# Patient Record
Sex: Female | Born: 1990 | Race: Black or African American | Hispanic: No | Smoking: Never smoker
Health system: Southern US, Community
[De-identification: ages and names within clinical notes are randomized; demographics above are authoritative.]

## PROBLEM LIST (undated history)

## (undated) ENCOUNTER — Ambulatory Visit (HOSPITAL_COMMUNITY): Source: Home / Self Care

## (undated) DIAGNOSIS — D649 Anemia, unspecified: Secondary | ICD-10-CM

## (undated) DIAGNOSIS — L309 Dermatitis, unspecified: Secondary | ICD-10-CM

## (undated) HISTORY — DX: Dermatitis, unspecified: L30.9

## (undated) HISTORY — DX: Anemia, unspecified: D64.9

## (undated) HISTORY — PX: OTHER SURGICAL HISTORY: SHX169

---
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
## 2011-05-16 ENCOUNTER — Emergency Department: Payer: Self-pay | Admitting: Emergency Medicine

## 2018-04-27 HISTORY — PX: WISDOM TOOTH EXTRACTION: SHX21

## 2018-12-28 NOTE — L&D Delivery Note (Addendum)
OB/GYN Faculty Practice Delivery Note  Lisa Zimmerman is a 28 y.o. [id number] s/p VD at [redacted]w[redacted]d. She was admitted for SOL.   ROM: 5h 52m with clear fluid GBS Status:  Negative/-- (10/22 1520) Maximum Maternal Temperature:  Temp (24hrs), Avg:98.1 F (36.7 C), Min:97.5 F (36.4 C), Max:98.6 F (37 C)  Labor Progress: . Patient arrived at 6 cm dilation and was eventually augmented with pitocin.   Delivery Date/Time: 10/30/2019 at 2319 Delivery: Called to room and patient was complete and pushing. Head delivered in ROP position.  Nuchal cord was present and immediately reduced. Shoulder and body delivered in usual fashion. Infant with spontaneous cry, placed on mother's abdomen, dried and stimulated. Cord clamped x 2 after 1-minute delay, and cut by father of the baby under my direct supervision. Cord blood drawn. Placenta delivered spontaneously with gentle cord traction. Fundus firm with massage and Pitocin. Labia, perineum, vagina, and cervix inspected and found to have a right labial tear that was repaired with 3-0 Vicryl and a second-degree perineal body tear that was repaired with 2-0 and 3-0 Vicryl with hemostasis achieved and cosmetic outcome achieved..   Placenta: Spontaneous, intact, three-vessel cord Complications: None Lacerations: Right labial tear and second-degree perineal body tear EBL: 172 ml  Analgesia: Epidural  Infant: APGAR (1 MIN): 9   APGAR (5 MINS): 9   APGAR (10 MINS):    Weight: Pending  Gifford Shave, MD  PGY-1, [facility]  10/30/2019 12:01 AM   OB FELLOW DELIVERY ATTESTATION  I was gloved and present for the delivery in its entirety, and I agree with the above resident's note.    Barrington Ellison, MD [facility] Family Medicine Fellow, [facility] for [employer], [place]

## 2019-06-28 LAB — OB RESULTS CONSOLE HIV ANTIBODY (ROUTINE TESTING): HIV: NONREACTIVE

## 2019-06-28 LAB — OB RESULTS CONSOLE HGB/HCT, BLOOD
HCT: 35 (ref 29–41)
Hemoglobin: 11.4

## 2019-06-28 LAB — OB RESULTS CONSOLE GC/CHLAMYDIA
Chlamydia: NEGATIVE
Gonorrhea: NEGATIVE

## 2019-06-28 LAB — CYSTIC FIBROSIS DIAGNOSTIC STUDY: Interpretation-CFDNA:: NEGATIVE

## 2019-06-28 LAB — OB RESULTS CONSOLE ABO/RH: RH Type: POSITIVE

## 2019-06-28 LAB — OB RESULTS CONSOLE HEPATITIS B SURFACE ANTIGEN: Hepatitis B Surface Ag: NEGATIVE

## 2019-06-28 LAB — OB RESULTS CONSOLE RUBELLA ANTIBODY, IGM: Rubella: IMMUNE

## 2019-06-28 LAB — OB RESULTS CONSOLE PLATELET COUNT: Platelets: 336

## 2019-06-28 LAB — OB RESULTS CONSOLE VARICELLA ZOSTER ANTIBODY, IGG: Varicella: IMMUNE

## 2019-06-28 LAB — SICKLE CELL SCREEN: Sickle Cell Screen: NEGATIVE

## 2019-06-28 LAB — OB RESULTS CONSOLE RPR: RPR: NONREACTIVE

## 2019-06-28 LAB — OB RESULTS CONSOLE ANTIBODY SCREEN: Antibody Screen: NEGATIVE

## 2019-07-13 ENCOUNTER — Emergency Department (HOSPITAL_COMMUNITY): Payer: Medicaid Other

## 2019-07-13 ENCOUNTER — Encounter (HOSPITAL_COMMUNITY): Payer: Self-pay

## 2019-07-13 ENCOUNTER — Emergency Department (HOSPITAL_COMMUNITY)
Admission: EM | Admit: 2019-07-13 | Discharge: 2019-07-13 | Disposition: A | Discharge status: Home / Self Care | Payer: Medicaid Other | Attending: Emergency Medicine | Admitting: Emergency Medicine

## 2019-07-13 ENCOUNTER — Other Ambulatory Visit: Payer: Self-pay

## 2019-07-13 DIAGNOSIS — O26899 Other specified pregnancy related conditions, unspecified trimester: Secondary | ICD-10-CM | POA: Insufficient documentation

## 2019-07-13 DIAGNOSIS — R1011 Right upper quadrant pain: Secondary | ICD-10-CM | POA: Diagnosis not present

## 2019-07-13 DIAGNOSIS — R1084 Generalized abdominal pain: Secondary | ICD-10-CM | POA: Insufficient documentation

## 2019-07-13 DIAGNOSIS — Z3A21 21 weeks gestation of pregnancy: Secondary | ICD-10-CM | POA: Insufficient documentation

## 2019-07-13 LAB — URINALYSIS, ROUTINE W REFLEX MICROSCOPIC
Bilirubin Urine: NEGATIVE
Glucose, UA: NEGATIVE mg/dL
Hgb urine dipstick: NEGATIVE
Ketones, ur: 5 mg/dL — AB
Leukocytes,Ua: NEGATIVE
Nitrite: NEGATIVE
Protein, ur: NEGATIVE mg/dL
Specific Gravity, Urine: 1.02 (ref 1.005–1.030)
pH: 6 (ref 5.0–8.0)

## 2019-07-13 LAB — CBC WITH DIFFERENTIAL/PLATELET
Abs Immature Granulocytes: 0.04 10*3/uL (ref 0.00–0.07)
Basophils Absolute: 0 10*3/uL (ref 0.0–0.1)
Basophils Relative: 0 %
Eosinophils Absolute: 0.2 10*3/uL (ref 0.0–0.5)
Eosinophils Relative: 2 %
HCT: 33.4 % — ABNORMAL LOW (ref 36.0–46.0)
Hemoglobin: 10.5 g/dL — ABNORMAL LOW (ref 12.0–15.0)
Immature Granulocytes: 1 %
Lymphocytes Relative: 24 %
Lymphs Abs: 2.1 10*3/uL (ref 0.7–4.0)
MCH: 30.4 pg (ref 26.0–34.0)
MCHC: 31.4 g/dL (ref 30.0–36.0)
MCV: 96.8 fL (ref 80.0–100.0)
Monocytes Absolute: 0.7 10*3/uL (ref 0.1–1.0)
Monocytes Relative: 8 %
Neutro Abs: 5.7 10*3/uL (ref 1.7–7.7)
Neutrophils Relative %: 65 %
Platelets: 277 10*3/uL (ref 150–400)
RBC: 3.45 MIL/uL — ABNORMAL LOW (ref 3.87–5.11)
RDW: 13.1 % (ref 11.5–15.5)
WBC: 8.7 10*3/uL (ref 4.0–10.5)
nRBC: 0 % (ref 0.0–0.2)

## 2019-07-13 LAB — COMPREHENSIVE METABOLIC PANEL
ALT: 14 U/L (ref 0–44)
AST: 15 U/L (ref 15–41)
Albumin: 3 g/dL — ABNORMAL LOW (ref 3.5–5.0)
Alkaline Phosphatase: 44 U/L (ref 38–126)
Anion gap: 8 (ref 5–15)
BUN: 7 mg/dL (ref 6–20)
CO2: 21 mmol/L — ABNORMAL LOW (ref 22–32)
Calcium: 8.3 mg/dL — ABNORMAL LOW (ref 8.9–10.3)
Chloride: 108 mmol/L (ref 98–111)
Creatinine, Ser: 0.48 mg/dL (ref 0.44–1.00)
GFR calc Af Amer: >60 mL/min (ref >60)
GFR calc non Af Amer: >60 mL/min (ref >60)
Glucose, Bld: 96 mg/dL (ref 70–99)
Potassium: 3.6 mmol/L (ref 3.5–5.1)
Sodium: 137 mmol/L (ref 135–145)
Total Bilirubin: <0.1 mg/dL — ABNORMAL LOW (ref 0.3–1.2)
Total Protein: 6.7 g/dL (ref 6.5–8.1)

## 2019-07-13 LAB — LIPASE, BLOOD: Lipase: 32 U/L (ref 11–51)

## 2019-07-13 MED ORDER — SODIUM CHLORIDE 0.9 % IV BOLUS
500.0000 mL | Freq: Once | INTRAVENOUS | Status: AC
  Administered 2019-07-13: 09:00:00 500 mL via INTRAVENOUS

## 2019-07-13 NOTE — ED Notes (Signed)
Patient transported to MRI 

## 2019-07-13 NOTE — ED Provider Notes (Signed)
Patient signed out to me by Dr. Laverta Baltimore pending MRI results.  MRI negative for appendicitis.  Does show 2 liver lesions and patient instructed to follow-up for this.   Lacretia Leigh, MD 07/13/19 1535

## 2019-07-13 NOTE — ED Notes (Signed)
Fetal heart rate= 150bpm

## 2019-07-13 NOTE — Discharge Instructions (Addendum)
Your MRI shows that you have 2 small lesions in your liver that will require an additional MRI after you give birth.  Please follow-up with this to make sure that you do not have anything serious

## 2019-07-13 NOTE — ED Triage Notes (Signed)
Pt c/o of R flank pain, that is tender to touch and began at 4 am.  Pt also vomited around 7 am.

## 2019-07-13 NOTE — ED Provider Notes (Signed)
Emergency Department Provider Note   I have reviewed the triage vital signs and the nursing notes.   HISTORY  Chief Complaint No chief complaint on file.   HPI Lisa Zimmerman is a 28 y.o. female G1P0 currently [redacted] weeks pregnant presents to the emergency department for evaluation of acute onset right flank and abdominal pain.  Symptoms began at 4 AM this morning with severe pain causing vomiting at 7 AM.  Patient denies radiation of symptoms.  She states that her pain is primarily in her right, mid back.  No blood in the vomit.  No shortness of breath or chest pain.  Denies diarrhea.  She denies any lower abdominal cramping or discomfort.  No vaginal bleeding.  Denies any vaginal fluid or discharge.  She states that she was treated for urinary tract infection last week and is not currently having dysuria, hesitancy, urgency.   History reviewed. No pertinent past medical history.  There are no active problems to display for this patient.   Past Surgical History:  Procedure Laterality Date   sinus polyp removal      Allergies Patient has no known allergies.  No family history on file.  Social History Social History   Tobacco Use   Smoking status: Not on file  Substance Use Topics   Alcohol use: Not Currently   Drug use: Not Currently    Review of Systems  Constitutional: No fever/chills Eyes: No visual changes. ENT: No sore throat. Cardiovascular: Denies chest pain. Respiratory: Denies shortness of breath. Gastrointestinal: Positive right flank/abdominal pain. Positive nausea and vomiting.  No diarrhea.  No constipation. Genitourinary: Negative for dysuria. Musculoskeletal: Positive right mid-back pain.  Skin: Negative for rash. Neurological: Negative for headaches, focal weakness or numbness.  10-point ROS otherwise negative.  ____________________________________________   PHYSICAL EXAM:  VITAL SIGNS: ED Triage Vitals  Enc Vitals Group     BP  07/13/19 0800 129/76     Pulse Rate 07/13/19 0800 81     Resp 07/13/19 0800 17     Temp 07/13/19 0800 98 F (36.7 C)     Temp Source 07/13/19 0800 Oral     SpO2 07/13/19 0800 100 %     Weight 07/13/19 0804 156 lb (70.8 kg)     Height 07/13/19 0804 5\' 4"  (1.626 m)   Constitutional: Alert and oriented. Well appearing and in no acute distress. Eyes: Conjunctivae are normal.  Head: Atraumatic. Nose: No congestion/rhinnorhea. Mouth/Throat: Mucous membranes are moist.  Neck: No stridor.   Cardiovascular: Normal rate, regular rhythm. Good peripheral circulation. Grossly normal heart sounds.   Respiratory: Normal respiratory effort.  No retractions. Lungs CTAB. Gastrointestinal: Soft with tenderness primarily along the right flank and RUQ. Mild RLQ tenderness. No left sided abdominal tenderness. Gravid abdomen.  Musculoskeletal: No gross deformities of extremities. Neurologic:  Normal speech and language.  Skin:  Skin is warm, dry and intact. No rash noted.  ____________________________________________   LABS (all labs ordered are listed, but only abnormal results are displayed)  Labs Reviewed  COMPREHENSIVE METABOLIC PANEL - Abnormal; Notable for the following components:      Result Value   CO2 21 (*)    Calcium 8.3 (*)    Albumin 3.0 (*)    Total Bilirubin <0.1 (*)    All other components within normal limits  CBC WITH DIFFERENTIAL/PLATELET - Abnormal; Notable for the following components:   RBC 3.45 (*)    Hemoglobin 10.5 (*)    HCT 33.4 (*)  All other components within normal limits  URINALYSIS, ROUTINE W REFLEX MICROSCOPIC - Abnormal; Notable for the following components:   Color, Urine AMBER (*)    Ketones, ur 5 (*)    All other components within normal limits  URINE CULTURE  LIPASE, BLOOD   ____________________________________________  RADIOLOGY  Dg Abdomen 1 View  Result Date: 07/13/2019 CLINICAL DATA:  Right flank pain EXAM: ABDOMEN - 1 VIEW COMPARISON:   Same-day renal ultrasound FINDINGS: Nonobstructive bowel gas pattern. Moderate to large colonic stool burden. No gross free intraperitoneal air. No pathologic calcification identified to suggest nephrolithiasis. Fetal skeleton projects over the right hemiabdomen. IMPRESSION: Moderate to large colonic stool burden. Fetal skeleton in situ. Electronically Signed   By: Duanne GuessNicholas  Plundo M.D.   On: 07/13/2019 13:16   Mr Pelvis Wo Contrast  Result Date: 07/13/2019 CLINICAL DATA:  Right lower abdominal and flank pain and tenderness. Clinical suspicion for appendicitis. [redacted] weeks pregnant. EXAM: MRI ABDOMEN AND PELVIS WITHOUT CONTRAST TECHNIQUE: Multiplanar multisequence MR imaging of the abdomen and pelvis was performed. No intravenous contrast was administered. COMPARISON:  None. FINDINGS: COMBINED FINDINGS FOR BOTH MR ABDOMEN AND PELVIS Lower chest: No acute findings. Hepatobiliary: A 2.7 cm lesion is seen in segment 7, and a 9 mm lesion is seen in the central left hepatic lobe adjacent to the left portal vein, both showing mild T2 hyperintensity. These lesions cannot be characterized on this unenhanced exam. Gallbladder is unremarkable. No evidence of biliary ductal dilatation. Pancreas: No mass or inflammatory process visualized on this unenhanced exam. Spleen:  Within normal limits in size. Adrenals/Urinary tract: Mild right hydroureteronephrosis seen, which appears to be due to compression of the distal right ureter by the gravid uterus. Stomach/Bowel: No evidence of obstruction, inflammatory process, or abnormal fluid collections. Normal appendix visualized. Vascular/Lymphatic: No pathologically enlarged lymph nodes identified. No evidence of abdominal aortic aneurysm. Reproductive: A single intrauterine fetus is seen in cephalic presentation. Other:  None. Musculoskeletal:  No suspicious bone lesions identified. IMPRESSION: Single intrauterine fetus in cephalic presentation. No evidence of appendicitis. Mild  right sided hydronephrosis of pregnancy. Two indeterminate liver lesions which cannot be characterized on this unenhanced exam. Recommend continued follow-up with abdomen MRI without and with contrast postpartum. Electronically Signed   By: Danae OrleansJohn A Stahl M.D.   On: 07/13/2019 15:11   Mr Abdomen Wo Contrast  Result Date: 07/13/2019 CLINICAL DATA:  Right lower abdominal and flank pain and tenderness. Clinical suspicion for appendicitis. [redacted] weeks pregnant. EXAM: MRI ABDOMEN AND PELVIS WITHOUT CONTRAST TECHNIQUE: Multiplanar multisequence MR imaging of the abdomen and pelvis was performed. No intravenous contrast was administered. COMPARISON:  None. FINDINGS: COMBINED FINDINGS FOR BOTH MR ABDOMEN AND PELVIS Lower chest: No acute findings. Hepatobiliary: A 2.7 cm lesion is seen in segment 7, and a 9 mm lesion is seen in the central left hepatic lobe adjacent to the left portal vein, both showing mild T2 hyperintensity. These lesions cannot be characterized on this unenhanced exam. Gallbladder is unremarkable. No evidence of biliary ductal dilatation. Pancreas: No mass or inflammatory process visualized on this unenhanced exam. Spleen:  Within normal limits in size. Adrenals/Urinary tract: Mild right hydroureteronephrosis seen, which appears to be due to compression of the distal right ureter by the gravid uterus. Stomach/Bowel: No evidence of obstruction, inflammatory process, or abnormal fluid collections. Normal appendix visualized. Vascular/Lymphatic: No pathologically enlarged lymph nodes identified. No evidence of abdominal aortic aneurysm. Reproductive: A single intrauterine fetus is seen in cephalic presentation. Other:  None. Musculoskeletal:  No  suspicious bone lesions identified. IMPRESSION: Single intrauterine fetus in cephalic presentation. No evidence of appendicitis. Mild right sided hydronephrosis of pregnancy. Two indeterminate liver lesions which cannot be characterized on this unenhanced exam.  Recommend continued follow-up with abdomen MRI without and with contrast postpartum. Electronically Signed   By: Marlaine Hind M.D.   On: 07/13/2019 15:11   US Renal  Result Date: 07/13/2019 CLINICAL DATA:  Right flank pain EXAM: RENAL / URINARY TRACT ULTRASOUND COMPLETE COMPARISON:  None. FINDINGS: Right Kidney: Renal measurements: 11.7 x 4.4 x 4.8 cm = volume: 127.4 mL . Echogenicity and renal cortical thickness are within normal limits. No mass or perinephric fluid visualized. There is slight fullness of the right renal collecting system. Left Kidney: Renal measurements: 11.0 x 5.9 x 4.9 cm = volume: 167.2 mL. Echogenicity and renal cortical thickness are within normal limits. No mass, perinephric fluid, or hydronephrosis visualized. No sonographically demonstrable calculus or ureterectasis. Bladder: Appears normal for degree of bladder distention. Intrauterine gestation impresses upon the bladder superiorly. IMPRESSION: Slight fullness of the right renal collecting system without obstructing focus evident. Study otherwise unremarkable. Electronically Signed   By: Lowella Grip III M.D.   On: 07/13/2019 11:59   US Abdomen Limited Ruq  Result Date: 07/13/2019 CLINICAL DATA:  Right upper quadrant pain EXAM: ULTRASOUND ABDOMEN LIMITED RIGHT UPPER QUADRANT COMPARISON:  None. FINDINGS: Gallbladder: No gallstones or wall thickening visualized. There is no pericholecystic fluid. There is mild sludge in the gallbladder. No sonographic Murphy sign noted by sonographer. Common bile duct: Diameter: 5 mm. No intrahepatic or extrahepatic biliary duct dilatation. Liver: No focal lesion identified. Within normal limits in parenchymal echogenicity. Portal vein is patent on color Doppler imaging with normal direction of blood flow towards the liver. IMPRESSION: There is mild sludge in the gallbladder. No gallstones, gallbladder wall thickening, or pericholecystic fluid. Study otherwise unremarkable. Electronically  Signed   By: Lowella Grip III M.D.   On: 07/13/2019 11:11    ____________________________________________   PROCEDURES  Procedure(s) performed:   Procedures  None  ____________________________________________   INITIAL IMPRESSION / ASSESSMENT AND PLAN / ED COURSE  Pertinent labs & imaging results that were available during my care of the patient were reviewed by me and considered in my medical decision making (see chart for details).   Patient presents to the emergency department for evaluation of right flank pain and abdominal discomfort.  No significant lower abdominal discomfort.  Mild pain in the right lower quadrant but lower suspicion for appendicitis but considering that anatomy could be distorted in pregnancy.  Plan for right upper quadrant ultrasound to assess for acute cholecystitis along with renal ultrasound to assess for right-sided hydronephrosis with possible kidney stone.   Renal ultrasound and right upper quadrant ultrasound reviewed.  Mild fullness on the right renal collecting system.  Likely physiologic from pregnancy but discussed with the patient obtaining a KUB to see a possible radiopaque stone.  Discussed the minimal radiation exposure during this procedure and patient consented to the film.  This was reviewed with moderate constipation but no stone visualized.  Reviewed the lab work.  Patient does not have leukocytosis.  No evidence of a UTI to suspect pyelonephritis.  I discussed the case with radiology regarding imaging selection.  Either CT abdomen pelvis or MRI w/o contrast would be acceptable.  I discussed these options with the patient and she would prefer no additional radiation and has opted for MRI of the abdomen without contrast.  This was ordered.  Overall, her pain is improved.  If MRI is negative she will be discharged home for follow-up with OB and stool softener Rx.   Care transferred to Dr. Freida BusmanAllen pending MRI results.    ____________________________________________  FINAL CLINICAL IMPRESSION(S) / ED DIAGNOSES  Final diagnoses:  RUQ abdominal pain    MEDICATIONS GIVEN DURING THIS VISIT:  Medications  sodium chloride 0.9 % bolus 500 mL (0 mLs Intravenous Stopped 07/13/19 1419)    Note:  This document was prepared using Dragon voice recognition software and may include unintentional dictation errors.  Alona BeneJoshua Demarques Pilz, MD Emergency Medicine    Patches Mcdonnell, Arlyss RepressJoshua G, MD 07/13/19 [phone]

## 2019-07-13 NOTE — ED Notes (Signed)
Pt informed of needed urine sample 

## 2019-07-14 LAB — URINE CULTURE
Culture: <10000 — AB
Special Requests: NORMAL

## 2019-08-28 ENCOUNTER — Encounter: Payer: Self-pay | Admitting: General Practice

## 2019-09-05 ENCOUNTER — Encounter: Payer: Self-pay | Admitting: General Practice

## 2019-09-06 ENCOUNTER — Encounter: Payer: Self-pay | Admitting: *Deleted

## 2019-09-06 ENCOUNTER — Other Ambulatory Visit: Payer: Self-pay | Admitting: *Deleted

## 2019-09-06 DIAGNOSIS — Z3480 Encounter for supervision of other normal pregnancy, unspecified trimester: Secondary | ICD-10-CM | POA: Insufficient documentation

## 2019-09-08 ENCOUNTER — Encounter: Payer: Self-pay | Admitting: General Practice

## 2019-09-13 ENCOUNTER — Encounter: Payer: Medicaid Other | Admitting: Certified Nurse Midwife

## 2019-09-14 ENCOUNTER — Encounter: Payer: Self-pay | Admitting: Obstetrics and Gynecology

## 2019-09-14 ENCOUNTER — Other Ambulatory Visit: Payer: Self-pay

## 2019-09-14 ENCOUNTER — Encounter: Payer: Self-pay | Admitting: General Practice

## 2019-09-14 ENCOUNTER — Ambulatory Visit (INDEPENDENT_AMBULATORY_CARE_PROVIDER_SITE_OTHER): Payer: Medicaid Other | Admitting: Obstetrics and Gynecology

## 2019-09-14 DIAGNOSIS — Z3A31 31 weeks gestation of pregnancy: Secondary | ICD-10-CM | POA: Diagnosis not present

## 2019-09-14 DIAGNOSIS — Z3483 Encounter for supervision of other normal pregnancy, third trimester: Secondary | ICD-10-CM

## 2019-09-14 DIAGNOSIS — Z3480 Encounter for supervision of other normal pregnancy, unspecified trimester: Secondary | ICD-10-CM

## 2019-09-14 MED ORDER — VITAFOL GUMMIES 3.33-0.333-34.8 MG PO CHEW: 3.0000 | CHEWABLE_TABLET | Freq: Every day | ORAL | 0 refills | Status: DC

## 2019-09-14 NOTE — Progress Notes (Signed)
Transfer Prenatal Care from [facility] --> see prenatal records under media tab Subjective:    Lisa Zimmerman is being seen today for her first obstetrical visit. She is transferring her prenatal care from [facility]. This is a planned pregnancy. She is at [redacted]w[redacted]d gestation. Her obstetrical history is significant for 2 lesions on liver. Dx'd 07/13/19.Marland Kitchen Relationship with FOB Lisa Zimmerman): significant other, living together. Patient does intend to breast feed. Pregnancy history fully reviewed. Records of [facility] reviewed.  Patient reports no complaints.  Review of Systems:   Review of Systems  Constitutional: Negative.   HENT: Negative.   Eyes: Negative.   Respiratory: Negative.   Cardiovascular: Negative.   Gastrointestinal: Negative.   Endocrine: Negative.   Genitourinary: Negative.   Musculoskeletal: Negative.   Skin: Negative.   Allergic/Immunologic: Negative.   Neurological: Negative.   Hematological: Negative.   Psychiatric/Behavioral: Negative.     Objective:     BP 111/75   Pulse (!) 103   Temp 98.8 F (37.1 C)   Wt 164 lb 12.8 oz (74.8 kg)   LMP 02/10/2019 (Approximate)   BMI 28.29 kg/m  Physical Exam  Nursing note and vitals reviewed. Constitutional: She is oriented to person, place, and time. She appears well-developed and well-nourished.  HENT:  Head: Normocephalic and atraumatic.  Eyes: Pupils are equal, round, and reactive to light.  Neck: Normal range of motion. Neck supple.  Cardiovascular: Normal rate, regular rhythm, normal heart sounds and intact distal pulses.  Respiratory: Effort normal and breath sounds normal.  GI: Soft. Bowel sounds are normal.  Genitourinary:    Genitourinary Comments: Pelvic deferred   Musculoskeletal: Normal range of motion.  Neurological: She is alert and oriented to person, place, and time. She has normal reflexes.  Skin: Skin is warm and dry.  Psychiatric: She has a normal mood and affect. Her behavior is normal. Judgment and thought content  normal.    Maternal Exam:  Abdomen: Patient reports no abdominal tenderness. Fundal height is 31.    Introitus: not evaluated.   Cervix: not evaluated.   Fetal Exam Fetal Monitor Review: Mode: hand-held doppler probe.   Baseline rate: 136 bpm.         Assessment:    Pregnancy: G2P0010 Patient Active Problem List   Diagnosis Date Noted  . Supervision of other normal pregnancy, antepartum 09/06/2019       Plan:   Prenatal labs reviewed. Prenatal vitamins. Problem list reviewed and updated. AFP3 discussed: results reviewed. Role of ultrasound in pregnancy discussed; fetal survey: results reviewed. Amniocentesis discussed: not indicated. The nature of [facility] with multiple MDs and other Advanced Practice Providers was explained to patient; also emphasized that residents, students are part of our team.  Discussed optimized OB schedule and video visits. Advised can have an in-office visit whenever she feels she needs to be seen.  Does not have own BP cuff. BP cuff Rx faxed today. Explained to patient that BP cuff will be mailed to her house. Advised to call during normal business hours and there is an after-hours nurse line available.  Follow up in 5 weeks for GBS and cervical exam. 50% of 40 min visit spent on counseling and coordination of care.     Laury Deep, MSN, CNM 09/14/2019

## 2019-09-15 NOTE — Progress Notes (Signed)
This encounter was created in error - please disregard.

## 2019-09-19 ENCOUNTER — Encounter: Payer: Self-pay | Admitting: General Practice

## 2019-10-19 ENCOUNTER — Ambulatory Visit (INDEPENDENT_AMBULATORY_CARE_PROVIDER_SITE_OTHER): Payer: Medicaid Other | Admitting: Obstetrics and Gynecology

## 2019-10-19 ENCOUNTER — Other Ambulatory Visit (HOSPITAL_COMMUNITY)
Admission: RE | Admit: 2019-10-19 | Discharge: 2019-10-19 | Disposition: A | Discharge status: Home / Self Care | Payer: Medicaid Other | Source: Ambulatory Visit | Attending: Obstetrics and Gynecology | Admitting: Obstetrics and Gynecology

## 2019-10-19 ENCOUNTER — Other Ambulatory Visit: Payer: Self-pay

## 2019-10-19 VITALS — BP 113/72 | HR 97 | Temp 98.6°F | Wt 170.2 lb

## 2019-10-19 DIAGNOSIS — Z3483 Encounter for supervision of other normal pregnancy, third trimester: Secondary | ICD-10-CM | POA: Diagnosis not present

## 2019-10-19 DIAGNOSIS — Z3480 Encounter for supervision of other normal pregnancy, unspecified trimester: Secondary | ICD-10-CM | POA: Insufficient documentation

## 2019-10-19 DIAGNOSIS — Z3A36 36 weeks gestation of pregnancy: Secondary | ICD-10-CM | POA: Diagnosis not present

## 2019-10-19 MED ORDER — VITAFOL GUMMIES 3.33-0.333-34.8 MG PO CHEW: 3.0000 | CHEWABLE_TABLET | Freq: Every day | ORAL | 0 refills | Status: DC

## 2019-10-19 MED ORDER — VITAFOL GUMMIES 3.33-0.333-34.8 MG PO CHEW: 3.0000 | CHEWABLE_TABLET | Freq: Every day | ORAL | 12 refills | Status: DC

## 2019-10-19 MED ORDER — VITAFOL GUMMIES 3.33-0.333-34.8 MG PO CHEW: 3.0000 | CHEWABLE_TABLET | Freq: Every day | ORAL | 10 refills | Status: DC

## 2019-10-19 NOTE — Progress Notes (Signed)
LOW-RISK PREGNANCY OFFICE VISIT Patient name: Lisa Zimmerman MRN [id number]  Date of birth: [birth year] Chief Complaint:   Routine Prenatal Visit  History of Present Illness:   Lisa Zimmerman is a 28 y.o. G89P0010 female at [redacted]w[redacted]d with an Estimated Date of Delivery: 11/12/19 being seen today for ongoing management of a low-risk pregnancy.  Today she reports no complaints. Contractions: Irregular. Vag. Bleeding: None.  Movement: Present. denies leaking of fluid. Review of Systems:   Pertinent items are noted in HPI Denies abnormal vaginal discharge w/ itching/odor/irritation, headaches, visual changes, shortness of breath, chest pain, abdominal pain, severe nausea/vomiting, or problems with urination or bowel movements unless otherwise stated above. Pertinent History Reviewed:  Reviewed past medical,surgical, social, obstetrical and family history.  Reviewed problem list, medications and allergies. Physical Assessment:   Vitals:   10/19/19 1512  BP: 113/72  Pulse: 97  Temp: 98.6 F (37 C)  Weight: 170 lb 3.2 oz (77.2 kg)  Body mass index is 29.21 kg/m.        Physical Examination:   General appearance: Well appearing, and in no distress  Mental status: Alert, oriented to person, place, and time  Skin: Warm & dry  Cardiovascular: Normal heart rate noted  Respiratory: Normal respiratory effort, no distress  Abdomen: Soft, gravid, nontender  Pelvic: Cervical exam performed  Dilation: Closed Effacement (%): 50 Station: -3  Extremities: Edema: None  Fetal Status: Fetal Heart Rate (bpm): 148 Fundal Height: 35 cm Movement: Present Presentation: Vertex  No results found for this or any previous visit (from the past 24 hour(s)).  Assessment & Plan:  1) Low-risk pregnancy G2P0010 at [redacted]w[redacted]d with an Estimated Date of Delivery: 11/12/19  - Discussed labor plans: only pans made thus far is to get an epidural   Meds:  Meds ordered this encounter  Medications  . DISCONTD: Prenatal Vit-Fe  Phos-FA-Omega (VITAFOL GUMMIES) 3.33-0.333-34.8 MG CHEW    Sig: Chew 3 each by mouth daily.    Dispense:  90 tablet    Refill:  10    Order Specific Question:   Lot Number?    Answer:   [id number]    Order Specific Question:   Expiration Date?    Answer:   05/27/2020    Order Specific Question:   Quantity    Answer:   15    Comments:   5/bottles, 3 each  . Prenatal Vit-Fe Phos-FA-Omega (VITAFOL GUMMIES) 3.33-0.333-34.8 MG CHEW    Sig: Chew 3 each by mouth daily.    Dispense:  90 tablet    Refill:  12  . Prenatal Vit-Fe Phos-FA-Omega (VITAFOL GUMMIES) 3.33-0.333-34.8 MG CHEW    Sig: Chew 3 each by mouth daily.    Dispense:  90 tablet    Refill:  0    Order Specific Question:   Lot Number?    Answer:   [id number]    Order Specific Question:   Expiration Date?    Answer:   05/27/2020    Order Specific Question:   Quantity    Answer:   18    Comments:   6 bottles/3 gummies   Labs/procedures today: GBS  Plan:  Continue routine obstetrical care   Reviewed: Preterm labor symptoms and general obstetric precautions including but not limited to vaginal bleeding, contractions, leaking of fluid and fetal movement were reviewed in detail with the patient.  All questions were answered. Has home bp cuff. Check bp weekly, let us know if >140/90.   Follow-up: Return in about  2 weeks (around 11/02/2019) for Return OB - My Chart video.  Orders Placed This Encounter  Procedures  . Culture, beta strep (group b only)   Laury Deep MSN, CNM 10/20/2019

## 2019-10-23 LAB — CULTURE, BETA STREP (GROUP B ONLY): Strep Gp B Culture: NEGATIVE

## 2019-10-26 ENCOUNTER — Telehealth: Payer: Self-pay | Admitting: *Deleted

## 2019-10-26 DIAGNOSIS — B3731 Acute candidiasis of vulva and vagina: Secondary | ICD-10-CM

## 2019-10-26 DIAGNOSIS — B373 Candidiasis of vulva and vagina: Secondary | ICD-10-CM

## 2019-10-26 LAB — CERVICOVAGINAL ANCILLARY ONLY
Bacterial Vaginitis (gardnerella): NEGATIVE
Candida Glabrata: POSITIVE — AB
Candida Vaginitis: POSITIVE — AB
Chlamydia: NEGATIVE
Comment: NEGATIVE
Comment: NEGATIVE
Comment: NEGATIVE
Comment: NEGATIVE
Comment: NEGATIVE
Comment: NORMAL
Neisseria Gonorrhea: NEGATIVE
Trichomonas: NEGATIVE

## 2019-10-26 MED ORDER — TERCONAZOLE 0.4 % VA CREA: 1.0000 | TOPICAL_CREAM | Freq: Every day | VAGINAL | 0 refills | Status: DC

## 2019-10-26 NOTE — Telephone Encounter (Signed)
Patient informed of +yeast and medication sent to pharmacy.  Derl Barrow, RN

## 2019-10-26 NOTE — Telephone Encounter (Signed)
-----   Message from Laury Deep, [place] sent at 10/26/2019  3:47 PM EDT ----- Please treat for yeast

## 2019-10-29 ENCOUNTER — Encounter (HOSPITAL_COMMUNITY): Payer: Self-pay | Admitting: *Deleted

## 2019-10-29 ENCOUNTER — Inpatient Hospital Stay (HOSPITAL_COMMUNITY): Payer: Medicaid Other | Admitting: Anesthesiology

## 2019-10-29 ENCOUNTER — Inpatient Hospital Stay (HOSPITAL_COMMUNITY)
Admission: AD | Admit: 2019-10-29 | Discharge: 2019-10-31 | DRG: 807 | Disposition: A | Discharge status: Home / Self Care | Payer: Medicaid Other | Attending: Obstetrics & Gynecology | Admitting: Obstetrics & Gynecology

## 2019-10-29 ENCOUNTER — Other Ambulatory Visit: Payer: Self-pay

## 2019-10-29 DIAGNOSIS — Z20828 Contact with and (suspected) exposure to other viral communicable diseases: Secondary | ICD-10-CM | POA: Diagnosis present

## 2019-10-29 DIAGNOSIS — Z3A38 38 weeks gestation of pregnancy: Secondary | ICD-10-CM

## 2019-10-29 DIAGNOSIS — O26893 Other specified pregnancy related conditions, third trimester: Secondary | ICD-10-CM | POA: Diagnosis present

## 2019-10-29 DIAGNOSIS — Z3480 Encounter for supervision of other normal pregnancy, unspecified trimester: Secondary | ICD-10-CM

## 2019-10-29 LAB — SARS CORONAVIRUS 2 BY RT PCR (HOSPITAL ORDER, PERFORMED IN ~~LOC~~ HOSPITAL LAB): SARS Coronavirus 2: NEGATIVE

## 2019-10-29 LAB — TYPE AND SCREEN
ABO/RH(D): A POS
Antibody Screen: NEGATIVE

## 2019-10-29 LAB — CBC
HCT: 35.7 % — ABNORMAL LOW (ref 36.0–46.0)
Hemoglobin: 11.2 g/dL — ABNORMAL LOW (ref 12.0–15.0)
MCH: 27.8 pg (ref 26.0–34.0)
MCHC: 31.4 g/dL (ref 30.0–36.0)
MCV: 88.6 fL (ref 80.0–100.0)
Platelets: 316 10*3/uL (ref 150–400)
RBC: 4.03 MIL/uL (ref 3.87–5.11)
RDW: 14.5 % (ref 11.5–15.5)
WBC: 8.9 10*3/uL (ref 4.0–10.5)
nRBC: 0 % (ref 0.0–0.2)

## 2019-10-29 LAB — POCT FERN TEST: POCT Fern Test: NEGATIVE

## 2019-10-29 LAB — ABO/RH: ABO/RH(D): A POS

## 2019-10-29 MED ORDER — LIDOCAINE-EPINEPHRINE (PF) 2 %-1:200000 IJ SOLN
INTRAMUSCULAR | Status: DC | PRN
  Administered 2019-10-29 (×2): 2 mL via EPIDURAL

## 2019-10-29 MED ORDER — OXYTOCIN 40 UNITS IN NORMAL SALINE INFUSION - SIMPLE MED
1.0000 m[IU]/min | INTRAVENOUS | Status: DC
  Administered 2019-10-29: 2 m[IU]/min via INTRAVENOUS

## 2019-10-29 MED ORDER — FENTANYL CITRATE (PF) 100 MCG/2ML IJ SOLN
INTRAMUSCULAR | Status: AC
  Filled 2019-10-29: qty 2

## 2019-10-29 MED ORDER — EPHEDRINE 5 MG/ML INJ: 10.0000 mg | INTRAVENOUS | Status: DC | PRN

## 2019-10-29 MED ORDER — LIDOCAINE HCL (PF) 1 % IJ SOLN: 30.0000 mL | INTRAMUSCULAR | Status: DC | PRN

## 2019-10-29 MED ORDER — OXYCODONE-ACETAMINOPHEN 5-325 MG PO TABS: 1.0000 | ORAL_TABLET | ORAL | Status: DC | PRN

## 2019-10-29 MED ORDER — PHENYLEPHRINE 40 MCG/ML (10ML) SYRINGE FOR IV PUSH (FOR BLOOD PRESSURE SUPPORT): 80.0000 ug | PREFILLED_SYRINGE | INTRAVENOUS | Status: DC | PRN

## 2019-10-29 MED ORDER — LACTATED RINGERS IV SOLN: 500.0000 mL | Freq: Once | INTRAVENOUS | Status: DC

## 2019-10-29 MED ORDER — LACTATED RINGERS IV SOLN
INTRAVENOUS | Status: DC
  Administered 2019-10-29 (×2): via INTRAVENOUS
  Administered 2019-10-29: 125 mL/h via INTRAVENOUS

## 2019-10-29 MED ORDER — FENTANYL-BUPIVACAINE-NACL 0.5-0.125-0.9 MG/250ML-% EP SOLN
12.0000 mL/h | EPIDURAL | Status: DC | PRN
  Filled 2019-10-29: qty 250

## 2019-10-29 MED ORDER — OXYTOCIN BOLUS FROM INFUSION
500.0000 mL | Freq: Once | INTRAVENOUS | Status: AC
  Administered 2019-10-29: 500 mL via INTRAVENOUS

## 2019-10-29 MED ORDER — FENTANYL CITRATE (PF) 100 MCG/2ML IJ SOLN
100.0000 ug | Freq: Once | INTRAMUSCULAR | Status: AC
  Administered 2019-10-29: 100 ug via INTRAVENOUS

## 2019-10-29 MED ORDER — TERBUTALINE SULFATE 1 MG/ML IJ SOLN: 0.2500 mg | Freq: Once | INTRAMUSCULAR | Status: DC | PRN

## 2019-10-29 MED ORDER — DIPHENHYDRAMINE HCL 50 MG/ML IJ SOLN: 12.5000 mg | INTRAMUSCULAR | Status: DC | PRN

## 2019-10-29 MED ORDER — LACTATED RINGERS IV SOLN
500.0000 mL | INTRAVENOUS | Status: DC | PRN
  Administered 2019-10-29 (×2): 500 mL via INTRAVENOUS

## 2019-10-29 MED ORDER — SODIUM CHLORIDE (PF) 0.9 % IJ SOLN
INTRAMUSCULAR | Status: DC | PRN
  Administered 2019-10-29: 12 mL/h via EPIDURAL

## 2019-10-29 MED ORDER — PHENYLEPHRINE 40 MCG/ML (10ML) SYRINGE FOR IV PUSH (FOR BLOOD PRESSURE SUPPORT)
80.0000 ug | PREFILLED_SYRINGE | INTRAVENOUS | Status: DC | PRN
  Filled 2019-10-29: qty 10

## 2019-10-29 MED ORDER — SOD CITRATE-CITRIC ACID 500-334 MG/5ML PO SOLN: 30.0000 mL | ORAL | Status: DC | PRN

## 2019-10-29 MED ORDER — OXYCODONE-ACETAMINOPHEN 5-325 MG PO TABS: 2.0000 | ORAL_TABLET | ORAL | Status: DC | PRN

## 2019-10-29 MED ORDER — ONDANSETRON HCL 4 MG/2ML IJ SOLN
4.0000 mg | Freq: Four times a day (QID) | INTRAMUSCULAR | Status: DC | PRN
  Administered 2019-10-29: 4 mg via INTRAVENOUS
  Filled 2019-10-29: qty 2

## 2019-10-29 MED ORDER — ACETAMINOPHEN 325 MG PO TABS: 650.0000 mg | ORAL_TABLET | ORAL | Status: DC | PRN

## 2019-10-29 MED ORDER — OXYTOCIN 40 UNITS IN NORMAL SALINE INFUSION - SIMPLE MED
2.5000 [IU]/h | INTRAVENOUS | Status: DC
  Administered 2019-10-29: 2.5 [IU]/h via INTRAVENOUS
  Filled 2019-10-29: qty 1000

## 2019-10-29 NOTE — MAU Note (Signed)
Lisa Zimmerman is a 28 y.o. at [redacted]w[redacted]d here in MAU reporting: contractions since about 0500, they are coming about every 3 minutes. Had some leaking last night, none since last night, it was clear. Some spotting. +FM  Onset of complaint: this morning  Pain score: 6/10  Vitals:   10/29/19 0945  BP: 118/77  Pulse: (!) 106  Resp: 18  Temp: 98.2 F (36.8 C)  SpO2: 99%     FHT: +FM  Lab orders placed from triage: none

## 2019-10-29 NOTE — Progress Notes (Signed)
Lisa Zimmerman is a 28 y.o. G2P0010 at [redacted]w[redacted]d admitted for active labor  Subjective: Pt comfortable with epidural, some mild intermittent rectal pressure.  Objective: BP 102/70   Pulse (!) 118   Temp 98.6 F (37 C) (Oral)   Resp 16   Ht 5\' 4"  (1.626 m)   Wt 78.9 kg   LMP 02/10/2019 (Approximate)   SpO2 97%   BMI 29.87 kg/m  I/O last 3 completed shifts: In: -  Out: 250 [Urine:250] No intake/output data recorded.  FHT:  FHR: 130 bpm, variability: moderate,  accelerations:  Present,  decelerations:  Absent UC:   regular, every 2-3 minutes SVE:   Dilation: 10 Effacement (%): 100 Station: Plus 2 Exam by:: Lattie Haw CNM  Labs: Lab Results  Component Value Date   WBC 8.9 10/29/2019   HGB 11.2 (L) 10/29/2019   HCT 35.7 (L) 10/29/2019   MCV 88.6 10/29/2019   PLT 316 10/29/2019    Assessment / Plan: Spontaneous labor, progressing normally    Labor: Pt is complete, desires to wait and labor down at this time.  Pt to report feeling strong rectal pressure or urge to push. Preeclampsia:  n/a Fetal Wellbeing:  Category I Pain Control:  Epidural I/D:  GBS neg Anticipated MOD:  NSVD  Fatima Blank 10/29/2019, 8:06 PM

## 2019-10-29 NOTE — Progress Notes (Signed)
Lisa Zimmerman is a 28 y.o. G2P0010 at [redacted]w[redacted]d admitted for active labor  Subjective: Pt comfortable with epidural. S/O in room for support.  Objective: BP (!) 90/46 (BP Location: Right Arm)   Pulse (!) 107   Temp (!) 97.5 F (36.4 C) (Oral)   Resp 16   Ht 5\' 4"  (1.626 m)   Wt 78.9 kg   LMP 02/10/2019 (Approximate)   SpO2 97%   BMI 29.87 kg/m  No intake/output data recorded. Total I/O In: -  Out: 250 [Urine:250]  FHT:  FHR: 130 bpm, variability: moderate,  accelerations:  Present,  decelerations:  Absent UC:   regular, every 3 minutes SVE:   Dilation: 9 Effacement (%): 100 Station: -2 Exam by:: CNM [place]kirby AROM with clear fluid, pt tolerated well  Labs: Lab Results  Component Value Date   WBC 8.9 10/29/2019   HGB 11.2 (L) 10/29/2019   HCT 35.7 (L) 10/29/2019   MCV 88.6 10/29/2019   PLT 316 10/29/2019    Assessment / Plan: Spontaneous labor, progressing normally  Labor: Progressing normally Preeclampsia:  n/a Fetal Wellbeing:  Category I Pain Control:  Epidural I/D:  GBS neg Anticipated MOD:  NSVD  Fatima Blank 10/29/2019, 6:04 PM

## 2019-10-29 NOTE — H&P (Signed)
Lisa Zimmerman is a 28 y.o. female G2P0010 at [redacted]w[redacted]d presenting for labor evaluation.  She reports painful contractions starting at 5 am.  Pregnancy has been uncomplicated except for liver lesions seen on abd/pelvic MRI, done 07/12/19 to rule out appendicitis.  Plan for MRI postpartum to evaluate.      Nursing Staff Provider  Office Location  [facility] Dating  U/S 07/10/2019  Language  English Anatomy US  Normal 07/10/2019  Flu Vaccine   Genetic Screen  NIPS:   AFP:   First Screen:   Quad: Negative   TDaP vaccine   Declined Hgb A1C or  GTT Early  Third trimester Normal 1 Hr 125 (06/28/19)  Rhogam  N/A   LAB RESULTS   Feeding Plan Breast Blood Type   A+  Contraception PoPs Antibody  Negative  Circumcision  Rubella  Immune  Pediatrician   RPR   NR  Support Person Kevin HBsAg   Negative  Prenatal Classes  HIV  NR  BTL Consent  GBS  NEGATIVE (For PCN allergy, check sensitivities)   VBAC Consent  Pap Negative 06/28/2019 Endocervical component absent  Lead Normal Hgb Electro  Normal  BP Cuff Ordered CF Negative  Varicella Immune SMA   Hep C NR Waterbirth  [ ]  Class [ ]  Consent [ ]  CNM visit   OB History    Gravida  2   Para      Term      Preterm      AB  1   Living        SAB      TAB      Ectopic      Multiple      Live Births             Past Medical History:  Diagnosis Date  . Anemia   . Eczema of both hands    Past Surgical History:  Procedure Laterality Date  . sinus polyp removal    . WISDOM TOOTH EXTRACTION  04/2018   Family History: family history includes Heart disease in her father; Hypertension in her father; Seizures in her brother. Social History:  reports that she is a non-smoker but has been exposed to tobacco smoke. She has never used smokeless tobacco. She reports previous alcohol use. She reports previous drug use. Drug: Marijuana.     Maternal Diabetes: No Genetic Screening: Normal Maternal Ultrasounds/Referrals: Normal Fetal Ultrasounds  or other Referrals:  None Maternal Substance Abuse:  No Significant Maternal Medications:  None Significant Maternal Lab Results:  Group B Strep negative Other Comments:  None  Review of Systems  Constitutional: Negative for chills and fever.  Respiratory: Negative for shortness of breath.   Cardiovascular: Negative for chest pain.  Gastrointestinal: Positive for abdominal pain. Negative for vomiting.  Neurological: Negative for dizziness and headaches.   Maternal Medical History:  Reason for admission: Contractions.   Contractions: Onset was 3-5 hours ago.   Frequency: regular.   Perceived severity is moderate.    Fetal activity: Perceived fetal activity is normal.   Last perceived fetal movement was within the past hour.    Prenatal complications: no prenatal complications Prenatal Complications - Diabetes: none.    Dilation: 6 Effacement (%): 90 Station: -2 Exam by:: Dorinda Hill RN  Blood pressure 112/77, pulse 91, temperature 98.2 F (36.8 C), temperature source Oral, resp. rate 18, height 5\' 4"  (1.626 m), weight 78.9 kg, last menstrual period 02/10/2019, SpO2 96 %. Maternal Exam:  Uterine  Assessment: Contraction strength is moderate.  Contraction frequency is regular.   Abdomen: Fetal presentation: vertex  Cervix: Cervix evaluated by digital exam.     Fetal Exam Fetal Monitor Review: Mode: ultrasound.   Baseline rate: 135.  Variability: moderate (6-25 bpm).   Pattern: accelerations present and no decelerations.    Fetal State Assessment: Category I - tracings are normal.     Physical Exam  Nursing note and vitals reviewed. Constitutional: She is oriented to person, place, and time. She appears well-developed and well-nourished.  Neck: Normal range of motion.  Cardiovascular: Normal rate, regular rhythm and normal heart sounds.  Respiratory: Effort normal and breath sounds normal.  GI: Soft.  Musculoskeletal: Normal range of motion.  Neurological:  She is alert and oriented to person, place, and time.  Skin: Skin is warm and dry.  Psychiatric: She has a normal mood and affect. Her behavior is normal. Judgment and thought content normal.    Prenatal labs: ABO, Rh: --/--/A POS, A POS Performed at [facility], [address] [place], [place] [address]  [phone]) Antibody: NEG (11/01 1112) Rubella: Immune (07/01 0000) RPR: Nonreactive (07/01 0000)  HBsAg: Negative (07/01 0000)  HIV: Non-reactive (07/01 0000)  GBS: Negative/-- (10/22 1520)   Assessment/Plan: 28 y.o. G2P0010 at [redacted]w[redacted]d with active labor at term GBS negative  Admit to L&D Expectant management IV pain management May have epidural if desired  Sharen Counter 10/29/2019, 1:46 PM

## 2019-10-29 NOTE — Anesthesia Preprocedure Evaluation (Signed)
Anesthesia Evaluation    Reviewed: Allergy & Precautions, Patient's Chart, lab work & pertinent test results  Airway Mallampati: II  TM Distance: >3 FB Neck ROM: Full    Dental no notable dental hx.    Pulmonary neg pulmonary ROS,    Pulmonary exam normal breath sounds clear to auscultation       Cardiovascular negative cardio ROS Normal cardiovascular exam Rhythm:Regular Rate:Normal     Neuro/Psych negative neurological ROS  negative psych ROS   GI/Hepatic negative GI ROS, Neg liver ROS,   Endo/Other  negative endocrine ROS  Renal/GU negative Renal ROS  negative genitourinary   Musculoskeletal negative musculoskeletal ROS (+)   Abdominal   Peds  Hematology  (+) Blood dyscrasia, anemia ,   Anesthesia Other Findings   Reproductive/Obstetrics (+) Pregnancy                             Anesthesia Physical Anesthesia Plan  ASA: II  Anesthesia Plan: Epidural   Post-op Pain Management:    Induction:   PONV Risk Score and Plan: Treatment may vary due to age or medical condition  Airway Management Planned: Natural Airway  Additional Equipment:   Intra-op Plan:   Post-operative Plan:   Informed Consent: I have reviewed the patients History and Physical, chart, labs and discussed the procedure including the risks, benefits and alternatives for the proposed anesthesia with the patient or authorized representative who has indicated his/her understanding and acceptance.       Plan Discussed with: Anesthesiologist  Anesthesia Plan Comments: (Patient identified. Risks, benefits, options discussed with patient including but not limited to bleeding, infection, nerve damage, paralysis, failed block, incomplete pain control, headache, blood pressure changes, nausea, vomiting, reactions to medication, itching, and post partum back pain. Confirmed with bedside nurse the patient's most recent  platelet count. Confirmed with the patient that they are not taking any anticoagulation, have any bleeding history or any family history of bleeding disorders. Patient expressed understanding and wishes to proceed. All questions were answered. )        Anesthesia Quick Evaluation

## 2019-10-29 NOTE — Anesthesia Procedure Notes (Signed)
Epidural Patient location during procedure: OB Start time: 10/29/2019 12:35 PM End time: 10/29/2019 12:50 PM  Staffing Anesthesiologist: Freddrick March, MD Performed: anesthesiologist   Preanesthetic Checklist Completed: patient identified, pre-op evaluation, timeout performed, IV checked, risks and benefits discussed and monitors and equipment checked  Epidural Patient position: sitting Prep: site prepped and draped and DuraPrep Patient monitoring: continuous pulse ox, blood pressure, heart rate and cardiac monitor Approach: midline Location: L3-L4 Injection technique: LOR air  Needle:  Needle type: Tuohy  Needle gauge: 17 G Needle length: 9 cm Needle insertion depth: 6 cm Catheter type: closed end flexible Catheter size: 19 Gauge Catheter at skin depth: 12 cm Test dose: negative  Assessment Sensory level: T8 Events: blood not aspirated, injection not painful, no injection resistance, negative IV test and no paresthesia  Additional Notes Patient identified. Risks/Benefits/Options discussed with patient including but not limited to bleeding, infection, nerve damage, paralysis, failed block, incomplete pain control, headache, blood pressure changes, nausea, vomiting, reactions to medication both or allergic, itching and postpartum back pain. Confirmed with bedside nurse the patient's most recent platelet count. Confirmed with patient that they are not currently taking any anticoagulation, have any bleeding history or any family history of bleeding disorders. Patient expressed understanding and wished to proceed. All questions were answered. Sterile technique was used throughout the entire procedure. Please see nursing notes for vital signs. Test dose was given through epidural catheter and negative prior to continuing to dose epidural or start infusion. Warning signs of high block given to the patient including shortness of breath, tingling/numbness in hands, complete motor block,  or any concerning symptoms with instructions to call for help. Patient was given instructions on fall risk and not to get out of bed. All questions and concerns addressed with instructions to call with any issues or inadequate analgesia.  Reason for block:procedure for pain

## 2019-10-29 NOTE — Progress Notes (Signed)
LABOR PROGRESS NOTE  Lisa Zimmerman is a 28 y.o. G2P0010 at [redacted]w[redacted]d  admitted for active labor.   Subjective: Intermittent pressure. Anxious about delivery.   Objective: BP 114/65   Pulse (!) 131   Temp 98.6 F (37 C) (Oral)   Resp 18   Ht 5\' 4"  (1.626 m)   Wt 78.9 kg   LMP 02/10/2019 (Approximate)   SpO2 97%   BMI 29.87 kg/m  or  Vitals:   10/29/19 1900 10/29/19 1930 10/29/19 2000 10/29/19 2030  BP: 103/63 102/70 116/62 114/65  Pulse: 86 (!) 118 88 (!) 131  Resp: 16  18 18   Temp:  98.6 F (37 C)    TempSrc:  Oral    SpO2:      Weight:      Height:       Dilation: 10 Dilation Complete Date: 10/29/19 Dilation Complete Time: 1947 Effacement (%): 100 Cervical Position: Middle Station: Plus 2 Presentation: Vertex Exam by:: Lattie Haw CNM FHT: baseline rate 135, moderate varibility, + acel, variable decel  Toco: q4-10 min  Labs: Lab Results  Component Value Date   WBC 8.9 10/29/2019   HGB 11.2 (L) 10/29/2019   HCT 35.7 (L) 10/29/2019   MCV 88.6 10/29/2019   PLT 316 10/29/2019    Patient Active Problem List   Diagnosis Date Noted  . Supervision of other normal pregnancy, antepartum 09/06/2019    Assessment / Plan: 28 y.o. G2P0010 at [redacted]w[redacted]d here for active labor   Labor: patient is complete at this time. Practice pushed a few times. But contractions spaced out. Will augment with pitocin at this time  Fetal Wellbeing:  Cat 2, reassuring given variability  Pain Control:  Epidural  Anticipated MOD:  Vaginal   Gifford Shave, MD  PGY-1, [facility]  10/29/2019, 9:31 PM

## 2019-10-30 ENCOUNTER — Encounter (HOSPITAL_COMMUNITY): Payer: Self-pay

## 2019-10-30 DIAGNOSIS — Z3A38 38 weeks gestation of pregnancy: Secondary | ICD-10-CM

## 2019-10-30 LAB — CBC
HCT: 33.2 % — ABNORMAL LOW (ref 36.0–46.0)
Hemoglobin: 10.3 g/dL — ABNORMAL LOW (ref 12.0–15.0)
MCH: 27.8 pg (ref 26.0–34.0)
MCHC: 31 g/dL (ref 30.0–36.0)
MCV: 89.7 fL (ref 80.0–100.0)
Platelets: 259 10*3/uL (ref 150–400)
RBC: 3.7 MIL/uL — ABNORMAL LOW (ref 3.87–5.11)
RDW: 14.6 % (ref 11.5–15.5)
WBC: 13.5 10*3/uL — ABNORMAL HIGH (ref 4.0–10.5)
nRBC: 0 % (ref 0.0–0.2)

## 2019-10-30 LAB — RPR: RPR Ser Ql: NONREACTIVE

## 2019-10-30 MED ORDER — ACETAMINOPHEN 325 MG PO TABS: 650.0000 mg | ORAL_TABLET | ORAL | Status: DC | PRN

## 2019-10-30 MED ORDER — COCONUT OIL OIL: 1.0000 "application " | TOPICAL_OIL | Status: DC | PRN

## 2019-10-30 MED ORDER — WITCH HAZEL-GLYCERIN EX PADS: 1.0000 "application " | MEDICATED_PAD | CUTANEOUS | Status: DC | PRN

## 2019-10-30 MED ORDER — ZOLPIDEM TARTRATE 5 MG PO TABS: 5.0000 mg | ORAL_TABLET | Freq: Every evening | ORAL | Status: DC | PRN

## 2019-10-30 MED ORDER — SENNOSIDES-DOCUSATE SODIUM 8.6-50 MG PO TABS
2.0000 | ORAL_TABLET | ORAL | Status: DC
  Administered 2019-10-31: 2 via ORAL
  Filled 2019-10-30: qty 2

## 2019-10-30 MED ORDER — SIMETHICONE 80 MG PO CHEW: 80.0000 mg | CHEWABLE_TABLET | ORAL | Status: DC | PRN

## 2019-10-30 MED ORDER — DIPHENHYDRAMINE HCL 25 MG PO CAPS: 25.0000 mg | ORAL_CAPSULE | Freq: Four times a day (QID) | ORAL | Status: DC | PRN

## 2019-10-30 MED ORDER — IBUPROFEN 600 MG PO TABS
600.0000 mg | ORAL_TABLET | Freq: Four times a day (QID) | ORAL | Status: DC
  Administered 2019-10-30 – 2019-10-31 (×6): 600 mg via ORAL
  Filled 2019-10-30 (×6): qty 1

## 2019-10-30 MED ORDER — TETANUS-DIPHTH-ACELL PERTUSSIS 5-2.5-18.5 LF-MCG/0.5 IM SUSP: 0.5000 mL | Freq: Once | INTRAMUSCULAR | Status: DC

## 2019-10-30 MED ORDER — DIBUCAINE (PERIANAL) 1 % EX OINT: 1.0000 "application " | TOPICAL_OINTMENT | CUTANEOUS | Status: DC | PRN

## 2019-10-30 MED ORDER — BENZOCAINE-MENTHOL 20-0.5 % EX AERO
1.0000 "application " | INHALATION_SPRAY | CUTANEOUS | Status: DC | PRN
  Administered 2019-10-30: 1 via TOPICAL
  Filled 2019-10-30: qty 56

## 2019-10-30 MED ORDER — ONDANSETRON HCL 4 MG PO TABS: 4.0000 mg | ORAL_TABLET | ORAL | Status: DC | PRN

## 2019-10-30 MED ORDER — ONDANSETRON HCL 4 MG/2ML IJ SOLN: 4.0000 mg | INTRAMUSCULAR | Status: DC | PRN

## 2019-10-30 MED ORDER — PRENATAL MULTIVITAMIN CH
1.0000 | ORAL_TABLET | Freq: Every day | ORAL | Status: DC
  Administered 2019-10-30 – 2019-10-31 (×2): 1 via ORAL
  Filled 2019-10-30 (×2): qty 1

## 2019-10-30 NOTE — Clinical Social Work Maternal (Signed)
CLINICAL SOCIAL WORK MATERNAL/CHILD NOTE  Patient Details  Name: Lisa Zimmerman MRN: [id number] Date of Birth: 03/07/1991  Date:  10/30/2019  Clinical Social Worker Initiating Note:  Quintel Mccalla Date/Time: Initiated:  10/30/19/1304     Child's Name:  Frank Carter   Biological Parents:  Mother, Father(Grisela Arel and Kevin Carter DOB: 11/15/1991)   Need for Interpreter:  None   Reason for Referral:  Current Substance Use/Substance Use During Pregnancy    Address:  [address], [place] [address]; MOB's mom, aunt, g-ma and cousin live at the address above. MOB stated she will be discharging to FOB's house where she stays sometimes - [address], [place], [place] [address].   Phone number:  [phone] (home)     Additional phone number:   Household Members/Support Persons (HM/SP):   Household Member/Support Person 1   HM/SP Name Relationship DOB or Age  HM/SP -1 Kevin Carter FOB 11/15/1991  HM/SP -2        HM/SP -3        HM/SP -4        HM/SP -5        HM/SP -6        HM/SP -7        HM/SP -8          Natural Supports (not living in the home):  Parent, Immediate Family, Extended Family   Professional Supports: None   Employment: Unemployed   Type of Work:     Education:  College graduate   Homebound arranged:    Financial Resources:  Medicaid   Other Resources:  Food Stamps , WIC   Cultural/Religious Considerations Which May Impact Care:    Strengths:  Ability to meet basic needs , Home prepared for child    Psychotropic Medications:         Pediatrician:       Pediatrician List:   [place]    [place]    [place]    [place]    [place]    [place]      Pediatrician Fax Number:    Risk Factors/Current Problems:  Substance Use    Cognitive State:  Alert , Able to Concentrate , Linear Thinking    Mood/Affect:  Calm , Comfortable , Interested    CSW Assessment:  CSW received  consult for THC use during pregnancy. CSW met with MOB to offer support and complete assessment.    MOB sitting up in bed holding infant with FOB asleep on the couch, when CSW entered the room. CSW introduced self and received verbal permission from MOB to complete assessment with FOB asleep on the couch. CSW explained reason for consult to which MOB expressed understanding. CSW confirmed MOB was living at the address provided on facesheet ([address]). MOB stated her mom, aunt, grandmother and cousin live at that address but that she sometimes stays at FOB's house and provided CSW with that address as that will be where MOB and infant discharge to. CSW inquired about MOB's mental health history as it was noted in MOB's PNC records that she was having symptoms of depression during her pregnancy. MOB reported she feels like she's always had depression and acknowledged feeling it during her pregnancy but attributed much of it to her hormones. MOB stated she is now feeling calm and content and denied any current medications or counseling. CSW inquired about MOB's interest in medications or counseling and MOB stated she didn't feel it   was something she needed at this time.   CSW provided education regarding the baby blues period vs. perinatal mood disorders, discussed treatment and gave resources for mental health follow up if concerns arise.  CSW recommends self-evaluation during the postpartum time period using the New Mom Checklist from Postpartum Progress and encouraged MOB to contact a medical professional if symptoms are noted at any time. MOB did not appear to be displaying any acute mental health symptoms and denied any current SI or HI. MOB reported feeling well-supported by FOB, her mom, her grandmother and FOB's sister.   CSW inquired about MOB's substance use during pregnancy and MOB acknowledged using marijuana to help with her depression symptoms but reported that she had stopped. Per MOB,  her last use was about a month and a half ago. CSW informed MOB of Hospital Drug Policy and explained UDS and CDS were still pending but that a CPS report would be made, if warranted. MOB appeared concerned at this so CSW provided details regarding what process would likely look like. MOB denied any further questions or concerns regarding report.   MOB confirmed having all essential items for infant once discharged and reported infant would be sleeping in a bassinet once home. CSW provided review of Sudden Infant Death Syndrome (SIDS) precautions and safe sleeping habits.  CSW Plan/Description:  No Further Intervention Required/No Barriers to Discharge, Sudden Infant Death Syndrome (SIDS) Education, Perinatal Mood and Anxiety Disorder (PMADs) Education, Hospital Drug Screen Policy Information, CSW Will Continue to Monitor Umbilical Cord Tissue Drug Screen Results and Make Report if Warranted    Baani Bober, LCSWA 10/30/2019, 2:14 PM 

## 2019-10-30 NOTE — Lactation Note (Addendum)
This note was copied from a baby's chart. Lactation Consultation Note Baby 5 hrs old. Baby will not open mouth very wide. Mom has med/lg. Semi flat nipples that are very compressible and evert well w/stimulation. Encouraged mom to finger roll nipples before latching. Baby is tongue thrusting at times.  Baby latched well several times, suckling well, holds it then suckles, then tongue thrust nipple out. Mom's breast are tender. Hand expression taught. Colostrum noted. Collected almost 10ml colostrum. Noted slight softening after hand expression and baby BF to Rt. Breast.  Mom has a scar from pimple from 2 weeks ago. Mom stated she keeps picking at it. Asked mom if she had neosporin, mom stated no, she was putting aloe on it. Encouraged mom she can put her colostrum and BM on it. If mom notices inflaming or draining, she needs to cover so baby doesn't touch it.   Newborn behavior, STS, I&O, milk storage, breast massage, supply and demand discussed. Mom encouraged to feed baby 8-12 times/24 hours and with feeding cues.   Baby is gagging at times as if needs to spit up but doesn't. RN reviewing using suction bulb. Encouraged mom if she has questions or concerns call for assistance. Lactation brochure given. Shells and hand pump taken to rm.    Patient Name: Lisa Zimmerman Mast [id number] Date: 10/30/2019 Reason for consult: Initial assessment;Primapara;Early term 37-38.6wks   Maternal Data Has patient been taught Hand Expression?: Yes Does the patient have breastfeeding experience prior to this delivery?: No  Feeding Feeding Type: Breast Fed  LATCH Score Latch: Repeated attempts needed to sustain latch, nipple held in mouth throughout feeding, stimulation needed to elicit sucking reflex.  Audible Swallowing: A few with stimulation  Type of Nipple: Flat  Comfort (Breast/Nipple): Soft / non-tender  Hold (Positioning): Full assist, staff holds infant at breast  LATCH Score:  5  Interventions Interventions: Breast feeding basics reviewed;Adjust position;Assisted with latch;Support pillows;Position options;Skin to skin;Breast massage;Expressed milk;Hand express;Breast compression  Lactation Tools Discussed/Used WIC Program: Yes   Consult Status Consult Status: Follow-up Date: 10/30/19 Follow-up type: In-patient    Theodoro Kalata 10/30/2019, 4:23 AM

## 2019-10-30 NOTE — Anesthesia Postprocedure Evaluation (Signed)
Anesthesia Post Note  Patient: Lisa Zimmerman  Procedure(s) Performed: AN AD [place]     Patient location during evaluation: Mother Baby Anesthesia Type: Epidural Level of consciousness: awake, awake and alert and oriented Pain management: pain level controlled Vital Signs Assessment: post-procedure vital signs reviewed and stable Respiratory status: spontaneous breathing, nonlabored ventilation and respiratory function stable Cardiovascular status: stable Postop Assessment: no headache, patient able to bend at knees, no apparent nausea or vomiting, adequate PO intake, able to ambulate and no backache Anesthetic complications: no    Last Vitals:  Vitals:   10/30/19 0319 10/30/19 0740  BP: (!) 113/56 105/61  Pulse: 99 81  Resp:  18  Temp: 37.1 C 37.3 C  SpO2: 100%     Last Pain:  Vitals:   10/30/19 0740  TempSrc: Oral  PainSc: 0-No pain   Pain Goal: Patients Stated Pain Goal: 2 (10/29/19 1140)                 Dent Plantz

## 2019-10-30 NOTE — Lactation Note (Signed)
This note was copied from a baby's chart. Lactation Consultation Note  Patient Name: Boy Tiena Manansala [id number] Date: 10/30/2019 Reason for consult: Early term 37-38.6wks;Primapara;1st time breastfeeding  P1 mother whose infant is now 3 hours old.  This is an ETI at 38+0 weeks.  Baby was awake when I arrived.  Offered to assist with latching and mother accepted.  She feels comfortable latching him on the left breast and asked if I could assist with the right breast.  She prefers the cross cradle position.  Suggested she remove baby's clothing and feed STS but she was not interested in doing this, even though I explained that he would not get cold.  Mother's breasts are large, soft and non tender and nipples are everted and intact.  Observed mother attempting to latch on the right breast.  She was trying to "force" her nipple into baby's mouth when he was not displaying a wide gape.  Much education provided regarding how to awaken a sleepy baby, how to obtain a wide gape, placement at the breast, compressions during feeding and proper positioning for mother and baby.  Assisted baby to latch deeply onto the right breast using the cross cradle hold.  Demonstrated breast compressions and baby began rhythmic sucking.  No swallows noted after 5 minutes of observation.  Supported mother with pillows and she felt comfortable.  Mother denied pain with latching.    She will continue to feed on cue and stated that baby does show feeding cues.  Asked her to call for latch assistance as needed.    Mother does not have a DEBP for home use.  She is a [facility] participant in [employer].  [facility] referral faxed.  Suggested mother follow up today with a phone call prior to the office closing at 1700.  Mother will do this.  Father asleep in the chair. RN updated.   Maternal Data Formula Feeding for Exclusion: No Has patient been taught Hand Expression?: Yes Does the patient have breastfeeding experience prior to this  delivery?: No  Feeding Feeding Type: Breast Fed  LATCH Score Latch: Grasps breast easily, tongue down, lips flanged, rhythmical sucking.  Audible Swallowing: None  Type of Nipple: Everted at rest and after stimulation  Comfort (Breast/Nipple): Soft / non-tender  Hold (Positioning): Assistance needed to correctly position infant at breast and maintain latch.  LATCH Score: 7  Interventions Interventions: Breast feeding basics reviewed;Assisted with latch;Breast massage;Hand express;Breast compression;Adjust position;Position options;Support pillows  Lactation Tools Discussed/Used WIC Program: Yes   Consult Status Consult Status: Follow-up Date: 10/31/19 Follow-up type: In-patient    Little Ishikawa 10/30/2019, 12:43 PM

## 2019-10-30 NOTE — Progress Notes (Signed)
POSTPARTUM PROGRESS NOTE  Post Partum Day 1  Subjective:  Lisa Zimmerman is a 28 y.o. G2P1011 s/p VD at [redacted]w[redacted]d.  She reports she is doing well. No acute events overnight. She denies any problems with ambulating, voiding or po intake. Denies nausea or vomiting.  Pain is well controlled.  Lochia is appropriate.  Objective: Blood pressure (!) 113/56, pulse 99, temperature 98.8 F (37.1 C), temperature source Oral, resp. rate 18, height 5\' 4"  (1.626 m), weight 78.9 kg, last menstrual period 02/10/2019, SpO2 100 %, unknown if currently breastfeeding.  Physical Exam:  General: alert, cooperative and no distress Chest: no respiratory distress Heart:regular rate, distal pulses intact Abdomen: soft, nontender,  Uterine Fundus: firm, appropriately tender DVT Evaluation: No calf swelling or tenderness Extremities: no lower extremity edema Skin: warm, dry  Recent Labs    10/29/19 1112  HGB 11.2*  HCT 35.7*    Assessment/Plan: Lisa Zimmerman is a 28 y.o. G2P1011 s/p VD at [redacted]w[redacted]d   PPD#1 - Doing well  Routine postpartum care Contraception: POP Feeding: breast  Dispo: Plan for discharge tomorrow.   LOS: 1 day   Gifford Shave, MD  PGY-1, [facility]  10/30/2019, 6:08 AM

## 2019-10-30 NOTE — Discharge Summary (Signed)
Postpartum Discharge Summary     Patient Name: Lisa Zimmerman DOB: 1991-08-24 MRN: [id number]  Date of admission: 10/29/2019 Delivering Provider: [facility]   Date of discharge: 10/31/2019  Admitting diagnosis: 37wks CTX 3 min appart Intrauterine pregnancy: [redacted]w[redacted]d    Secondary diagnosis:  Active Problems:   [redacted] weeks gestation of pregnancy   Labor and delivery, indication for care  Additional problems: None     Discharge diagnosis: Term Pregnancy Delivered                                                                                                Post partum procedures:None  Augmentation: Pitocin  Complications: None  Hospital course:  Onset of Labor With Vaginal Delivery     28y.o. yo G2P1011 at 370w0das admitted in Active Labor on 10/29/2019. Patient had an uncomplicated labor course as follows. She arrived with initial SVE 6/90/-2. Patient received Epidural. She eventually received Pitocin to augment labor and had an uncomplicated delivery.  Membrane Rupture Time/Date: 5:28 PM ,10/29/2019   Intrapartum Procedures: Episiotomy: None [1]                                         Lacerations:  2nd degree [3];Labial [10]  Patient had a delivery of a Viable infant. 10/29/2019  Information for the patient's newborn:  JoLeetta, Hendriks0[[id number]]Delivery Method: Vaginal, Spontaneous(Filed from Delivery Summary)     Pateint had an uncomplicated postpartum course.  She is ambulating, tolerating a regular diet, passing flatus, and urinating well. Patient is discharged home in stable condition on 10/31/19.  Delivery time: 11:19 PM    Magnesium Sulfate received: No BMZ received: No Rhophylac:No MMR:No Transfusion:No  Physical exam  Vitals:   10/30/19 1417 10/30/19 1902 10/30/19 2204 10/31/19 0616  BP: 115/70 116/72 114/79 99/70  Pulse: 92 93 82 70  Resp: '20 18 18 18  ' Temp: 98 F (36.7 C) 98.4 F (36.9 C) 98.3 F (36.8 C) 98.4 F (36.9 C)  TempSrc: Oral  Oral  Oral  SpO2: 99% 100%  100%  Weight:      Height:       General: alert, cooperative and no distress Lochia: appropriate Uterine Fundus: firm Incision: N/A DVT Evaluation: No evidence of DVT seen on physical exam. No significant calf/ankle edema. Labs: Lab Results  Component Value Date   WBC 13.5 (H) 10/30/2019   HGB 10.3 (L) 10/30/2019   HCT 33.2 (L) 10/30/2019   MCV 89.7 10/30/2019   PLT 259 10/30/2019   CMP Latest Ref Rng & Units 07/13/2019  Glucose 70 - 99 mg/dL 96  BUN 6 - 20 mg/dL 7  Creatinine 0.44 - 1.00 mg/dL 0.48  Sodium 135 - 145 mmol/L 137  Potassium 3.5 - 5.1 mmol/L 3.6  Chloride 98 - 111 mmol/L 108  CO2 22 - 32 mmol/L 21(L)  Calcium 8.9 - 10.3 mg/dL 8.3(L)  Total Protein 6.5 - 8.1 g/dL 6.7  Total Bilirubin 0.3 - 1.2  mg/dL <0.1(L)  Alkaline Phos 38 - 126 U/L 44  AST 15 - 41 U/L 15  ALT 0 - 44 U/L 14    Discharge instruction: per After Visit Summary and "Baby and Me Booklet".  After visit meds:  Allergies as of 10/31/2019   No Known Allergies     Medication List    STOP taking these medications   terconazole 0.4 % vaginal cream Commonly known as: TERAZOL 7     TAKE these medications   acetaminophen 500 MG tablet Commonly known as: TYLENOL Take 500 mg by mouth every 6 (six) hours as needed for mild pain.   calcium carbonate 500 MG chewable tablet Commonly known as: TUMS - dosed in mg elemental calcium Chew 2 tablets by mouth as needed for indigestion or heartburn.   ferrous sulfate 325 (65 FE) MG tablet Take 1 tablet (325 mg total) by mouth every other day.   ibuprofen 600 MG tablet Commonly known as: ADVIL Take 1 tablet (600 mg total) by mouth every 6 (six) hours.   polyethylene glycol powder 17 GM/SCOOP powder Commonly known as: GLYCOLAX/MIRALAX Take 255 g by mouth once for 1 dose.   Vitafol Gummies 3.33-0.333-34.8 MG Chew Chew 3 each by mouth daily. What changed: Another medication with the same name was removed. Continue taking this  medication, and follow the directions you see here.       Diet: routine diet  Activity: Advance as tolerated. Pelvic rest for 6 weeks.   Outpatient follow up:4 weeks Follow up Appt: Future Appointments  Date Time Provider [place]  11/30/2019 10:10 AM Laury Deep, CNM [facility] None   Follow up Visit:   Please schedule this patient for Postpartum visit in: 4 weeks with the following provider: Any provider Low risk pregnancy complicated by: None Delivery mode:  SVD Anticipated Birth Control:  Depo PP Procedures needed: None  Schedule Integrated [place] visit: no      Newborn Data: Live born female  Birth Weight: 6 lb 7.2 oz (2925 g) APGAR: 9, 9  Newborn Delivery   Birth date/time: 10/29/2019 23:19:00 Delivery type: Vaginal, Spontaneous      Baby Feeding: Breast Disposition:home with mother   10/31/2019 Clarnce Flock, MD

## 2019-10-31 MED ORDER — FERROUS SULFATE 325 (65 FE) MG PO TABS
325.0000 mg | ORAL_TABLET | ORAL | Status: DC
  Administered 2019-10-31: 325 mg via ORAL
  Filled 2019-10-31: qty 1

## 2019-10-31 MED ORDER — IBUPROFEN 600 MG PO TABS: 600.0000 mg | ORAL_TABLET | Freq: Four times a day (QID) | ORAL | 0 refills | Status: DC

## 2019-10-31 MED ORDER — FERROUS SULFATE 325 (65 FE) MG PO TABS: 325.0000 mg | ORAL_TABLET | ORAL | 0 refills | Status: DC

## 2019-10-31 MED ORDER — POLYETHYLENE GLYCOL 3350 17 GM/SCOOP PO POWD: 1.0000 | Freq: Once | ORAL | 0 refills | Status: AC

## 2019-10-31 MED ORDER — MEDROXYPROGESTERONE ACETATE 150 MG/ML IM SUSP
150.0000 mg | Freq: Once | INTRAMUSCULAR | Status: AC
  Administered 2019-10-31: 150 mg via INTRAMUSCULAR
  Filled 2019-10-31: qty 1

## 2019-10-31 NOTE — Progress Notes (Addendum)
Post Partum Day 2 Subjective: no complaints, up ad lib, voiding, tolerating PO and + flatus  Objective: Blood pressure 99/70, pulse 70, temperature 98.4 F (36.9 C), temperature source Oral, resp. rate 18, height 5\' 4"  (1.626 m), weight 78.9 kg, last menstrual period 02/10/2019, SpO2 100 %, unknown if currently breastfeeding.  Physical Exam:  General: alert and no distress Lochia: appropriate Uterine Fundus: firm DVT Evaluation: No cords or calf tenderness. No significant calf/ankle edema.  Recent Labs    10/29/19 1112 10/30/19 0536  HGB 11.2* 10.3*  HCT 35.7* 33.2*    Assessment/Plan: Discharge home, Breastfeeding, Circumcision prior to discharge and Contraception Progesterone only pill Patient was counseled on importance of taking birth control pills at the same time daily.    LOS: 2 days     Dorothyann Peng 10/31/2019, 7:56 AM   OB FELLOW ATTESTATION  I have seen and examined this patient. See same day D/c summary.   Augustin Coupe, MD/[facility] OB Fellow  10/31/2019, 10:09 PM

## 2019-10-31 NOTE — Discharge Instructions (Signed)

## 2019-10-31 NOTE — Lactation Note (Signed)
This note was copied from a baby's chart. Lactation Consultation Note  Patient Name: Lisa Zimmerman [id number] Date: 10/31/2019 Reason for consult: Follow-up assessment;1st time breastfeeding;Primapara;Early term 37-38.6wks;Infant weight loss;Other (Comment)(5 % weight loss/ milk coming in)  Baby is 58 hours old  LC reviewed and updated the doc flow sheets/ WNL  As LC entered the room baby awake and hungry, wet and stool changed by LC.  LC assisted mom to latch on the right breast / football/ with depth and baby fed for 20 mins and softened filling breast. Per mom comfortable with feeding and nipple well rounded when baby released. Multiple swallows noted, increased with breast compressions.  LC instructed mom on the use of the hand pump , #24 F good for today and #27 F provided for when milk comes in. And shells . Per mom having some soreness and no breakdown noted.  Per mom active with [facility] and is aware they are a good resource.  Mom has the pamphlet for D/C with phone numbers.     Maternal Data Has patient been taught Hand Expression?: Yes  Feeding Feeding Type: Breast Fed  LATCH Score Latch: Grasps breast easily, tongue down, lips flanged, rhythmical sucking.  Audible Swallowing: Spontaneous and intermittent  Type of Nipple: Everted at rest and after stimulation  Comfort (Breast/Nipple): Filling, red/small blisters or bruises, mild/mod discomfort  Hold (Positioning): Assistance needed to correctly position infant at breast and maintain latch.  LATCH Score: 8  Interventions Interventions: Breast feeding basics reviewed;Assisted with latch;Skin to skin;Breast massage;Hand express;Breast compression;Adjust position;Support pillows;Position options;Shells;Hand pump  Lactation Tools Discussed/Used Tools: Shells;Pump;Flanges Flange Size: 24;27(#24 F good fit today / #27 F for when milk comes in) Shell Type: Inverted Breast pump type: Manual WIC Program: Yes Pump Review: Setup,  frequency, and cleaning;Milk Storage Initiated by:: MAI Date initiated:: 10/31/19   Consult Status Consult Status: Complete Date: 10/31/19    Jerlyn Ly Soren Lazarz 10/31/2019, 9:09 AM

## 2019-11-02 ENCOUNTER — Telehealth: Payer: Medicaid Other | Admitting: Obstetrics and Gynecology

## 2019-11-30 ENCOUNTER — Encounter: Payer: Self-pay | Admitting: Gastroenterology

## 2019-11-30 ENCOUNTER — Other Ambulatory Visit: Payer: Self-pay

## 2019-11-30 ENCOUNTER — Encounter: Payer: Self-pay | Admitting: Obstetrics and Gynecology

## 2019-11-30 ENCOUNTER — Telehealth (INDEPENDENT_AMBULATORY_CARE_PROVIDER_SITE_OTHER): Payer: Medicaid Other | Admitting: Obstetrics and Gynecology

## 2019-11-30 DIAGNOSIS — Z1389 Encounter for screening for other disorder: Secondary | ICD-10-CM | POA: Diagnosis not present

## 2019-11-30 DIAGNOSIS — K769 Liver disease, unspecified: Secondary | ICD-10-CM

## 2019-11-30 NOTE — Patient Instructions (Signed)
Take Magnesium 250 mg 1-2 tablets at bedtime for constipation

## 2019-11-30 NOTE — Progress Notes (Signed)
MY CHART VIDEO POSTPARTUM VISIT ENCOUNTER NOTE  I connected with@ on 12/03/19 at 10:10 AM EST by My Chart video at home and verified that I am speaking with the correct person using two identifiers.   I discussed the limitations, risks, security and privacy concerns of performing an evaluation and management service by My Chart video and the availability of in person appointments. I also discussed with the patient that there may be a patient responsible charge related to this service. The patient expressed understanding and agreed to proceed.  Appointment Date: 12/03/2019  OBGYN Clinic: [facility]  Chief Complaint:  Postpartum Visit  History of Present Illness: Lisa Zimmerman is a 28 y.o. African-American G2P1011 (Patient's last menstrual period was 02/10/2019 (approximate).), seen for the above chief complaint. Her past medical history is significant for liver lesions.   She is s/p normal spontaneous vaginal delivery on 10/29/2019 at 38 weeks; she was discharged to home on 10/31/2019 PPD#3. Pregnancy complicated by liver lesions. Baby is doing well.  Complains of constipation  Vaginal bleeding or discharge: yes discharge (light yellow, slight odor) about 2 weeks Mode of feeding infant: Breast Intercourse: No  Contraception: Depo-Provera PP depression s/s: Yes . Score 6 Any bowel or bladder issues: Yes  Pap smear: NILM: EC/TZ absent (date: 06/28/2019). Repeat per routine screening per ASCCP guidelines.  Review of Systems: Positive for yellow vaginal discharge with an odor. Her 12 point review of systems is negative or as noted in the History of Present Illness.  Patient Active Problem List   Diagnosis Date Noted  . [redacted] weeks gestation of pregnancy 10/30/2019  . Labor and delivery, indication for care 10/30/2019  . Supervision of other normal pregnancy, antepartum 09/06/2019    Medications Bryn Saline. Detweiler had no medications administered during this visit. Current Outpatient  Medications  Medication Sig Dispense Refill  . ferrous sulfate 325 (65 FE) MG tablet Take 1 tablet (325 mg total) by mouth every other day. 30 tablet 0  . ibuprofen (ADVIL) 600 MG tablet Take 1 tablet (600 mg total) by mouth every 6 (six) hours. 30 tablet 0  . acetaminophen (TYLENOL) 500 MG tablet Take 500 mg by mouth every 6 (six) hours as needed for mild pain.    . calcium carbonate (TUMS - DOSED IN MG ELEMENTAL CALCIUM) 500 MG chewable tablet Chew 2 tablets by mouth as needed for indigestion or heartburn.    . Prenatal Vit-Fe Phos-FA-Omega (VITAFOL GUMMIES) 3.33-0.333-34.8 MG CHEW Chew 3 each by mouth daily. (Patient not taking: Reported on 11/30/2019) 90 tablet 12   No current facility-administered medications for this visit.     Allergies Patient has no known allergies.  Physical Exam:  General:  Alert, oriented and cooperative.   Mental Status: Normal mood and affect perceived. Normal judgment and thought content.  Rest of physical exam deferred due to type of encounter  PP Depression Screening:   Edinburgh Postnatal Depression Scale - 11/30/19 1016      Edinburgh Postnatal Depression Scale:  In the Past 7 Days   I have been able to laugh and see the funny side of things.  0    I have looked forward with enjoyment to things.  0    I have blamed myself unnecessarily when things went wrong.  1    I have been anxious or worried for no good reason.  2    I have felt scared or panicky for no good reason.  2  Things have been getting on top of me.  0    I have been so unhappy that I have had difficulty sleeping.  0    I have felt sad or miserable.  1    I have been so unhappy that I have been crying.  0    The thought of harming myself has occurred to me.  0    Edinburgh Postnatal Depression Scale Total  6       Assessment:Patient is a 28 y.o. G2P1011 who is 4 weeks postpartum from a normal spontaneous vaginal delivery.  She is doing well.   Plan: 1. Encounter for postpartum  visit - Normal postpartum visit - Continue Depo on schedule - Pap done 06/28/2019 - normal, next due 06/2022  2. Liver lesion  - No complaints of abdominal pain - No GI provider to f/u lesions seen on prior scan - Ambulatory referral to Gastroenterology    RTC 1 year for AEX  I discussed the assessment and treatment plan with the patient. The patient was provided an opportunity to ask questions and all were answered. The patient agreed with the plan and demonstrated an understanding of the instructions.   The patient was advised to call back or seek an in-person evaluation/go to the ED for any concerning postpartum symptoms.  I provided 10 minutes of non-face-to-face time during this encounter. There was 5 minutes of chart review time spent prior to this encounter. Total time spent = 15 minutes.   Center for [employer], [facility]

## 2019-12-30 ENCOUNTER — Encounter (HOSPITAL_COMMUNITY): Payer: Self-pay | Admitting: Emergency Medicine

## 2019-12-30 ENCOUNTER — Emergency Department (HOSPITAL_COMMUNITY)
Admission: EM | Admit: 2019-12-30 | Discharge: 2019-12-30 | Disposition: A | Discharge status: Home / Self Care | Payer: Medicaid Other | Attending: Emergency Medicine | Admitting: Emergency Medicine

## 2019-12-30 ENCOUNTER — Other Ambulatory Visit: Payer: Self-pay

## 2019-12-30 ENCOUNTER — Emergency Department (HOSPITAL_COMMUNITY): Payer: Medicaid Other

## 2019-12-30 DIAGNOSIS — Z7722 Contact with and (suspected) exposure to environmental tobacco smoke (acute) (chronic): Secondary | ICD-10-CM | POA: Insufficient documentation

## 2019-12-30 DIAGNOSIS — K8020 Calculus of gallbladder without cholecystitis without obstruction: Secondary | ICD-10-CM | POA: Insufficient documentation

## 2019-12-30 DIAGNOSIS — Z79899 Other long term (current) drug therapy: Secondary | ICD-10-CM | POA: Insufficient documentation

## 2019-12-30 DIAGNOSIS — R1010 Upper abdominal pain, unspecified: Secondary | ICD-10-CM

## 2019-12-30 LAB — LIPASE, BLOOD: Lipase: 42 U/L (ref 11–51)

## 2019-12-30 LAB — COMPREHENSIVE METABOLIC PANEL
ALT: 59 U/L — ABNORMAL HIGH (ref 0–44)
AST: 113 U/L — ABNORMAL HIGH (ref 15–41)
Albumin: 4 g/dL (ref 3.5–5.0)
Alkaline Phosphatase: 68 U/L (ref 38–126)
Anion gap: 9 (ref 5–15)
BUN: 15 mg/dL (ref 6–20)
CO2: 26 mmol/L (ref 22–32)
Calcium: 9.2 mg/dL (ref 8.9–10.3)
Chloride: 105 mmol/L (ref 98–111)
Creatinine, Ser: 0.92 mg/dL (ref 0.44–1.00)
GFR calc Af Amer: >60 mL/min (ref >60)
GFR calc non Af Amer: >60 mL/min (ref >60)
Glucose, Bld: 120 mg/dL — ABNORMAL HIGH (ref 70–99)
Potassium: 3.3 mmol/L — ABNORMAL LOW (ref 3.5–5.1)
Sodium: 140 mmol/L (ref 135–145)
Total Bilirubin: 0.5 mg/dL (ref 0.3–1.2)
Total Protein: 7.5 g/dL (ref 6.5–8.1)

## 2019-12-30 LAB — CBC
HCT: 42 % (ref 36.0–46.0)
Hemoglobin: 12.6 g/dL (ref 12.0–15.0)
MCH: 27.3 pg (ref 26.0–34.0)
MCHC: 30 g/dL (ref 30.0–36.0)
MCV: 90.9 fL (ref 80.0–100.0)
Platelets: 326 10*3/uL (ref 150–400)
RBC: 4.62 MIL/uL (ref 3.87–5.11)
RDW: 14.6 % (ref 11.5–15.5)
WBC: 15.7 10*3/uL — ABNORMAL HIGH (ref 4.0–10.5)
nRBC: 0 % (ref 0.0–0.2)

## 2019-12-30 LAB — I-STAT BETA HCG BLOOD, ED (MC, WL, AP ONLY): I-stat hCG, quantitative: <5 m[IU]/mL (ref <5)

## 2019-12-30 MED ORDER — ONDANSETRON HCL 4 MG/2ML IJ SOLN
4.0000 mg | Freq: Once | INTRAMUSCULAR | Status: AC
  Administered 2019-12-30: 13:00:00 4 mg via INTRAVENOUS
  Filled 2019-12-30: qty 2

## 2019-12-30 MED ORDER — HYDROCODONE-ACETAMINOPHEN 5-325 MG PO TABS: 1.0000 | ORAL_TABLET | Freq: Four times a day (QID) | ORAL | 0 refills | Status: DC | PRN

## 2019-12-30 MED ORDER — ONDANSETRON HCL 4 MG PO TABS: 4.0000 mg | ORAL_TABLET | Freq: Three times a day (TID) | ORAL | 0 refills | Status: DC | PRN

## 2019-12-30 MED ORDER — FAMOTIDINE IN NACL 20-0.9 MG/50ML-% IV SOLN
20.0000 mg | Freq: Once | INTRAVENOUS | Status: AC
  Administered 2019-12-30: 13:00:00 20 mg via INTRAVENOUS
  Filled 2019-12-30: qty 50

## 2019-12-30 MED ORDER — SODIUM CHLORIDE 0.9% FLUSH
3.0000 mL | Freq: Once | INTRAVENOUS | Status: AC
  Administered 2019-12-30: 13:00:00 3 mL via INTRAVENOUS

## 2019-12-30 MED ORDER — KETOROLAC TROMETHAMINE 15 MG/ML IJ SOLN
15.0000 mg | Freq: Once | INTRAMUSCULAR | Status: AC
  Administered 2019-12-30: 14:00:00 15 mg via INTRAVENOUS
  Filled 2019-12-30: qty 1

## 2019-12-30 NOTE — Discharge Instructions (Signed)
Your work up today showed gallstones.  Use zofran as needed for nausea or vomiting.  Use tylenol and ibuprofen as needed for mild to moderate pain. Use norco as needed for sever or breakthrough pain. Have cautions, this is narcotic medicine do not drive or operate heavy machinery while taking this medicine. Do not breast feed while taking this medicine.  Be careful with your diet. Eat low fat, small meals, to decrease symptoms. There is information about this in the paperwork.  Follow up with general surgery (office listed below) for further evaluation and management of your gallbladder.  Return to the ER if you develop high fevers, persistent vomiting, severe worsening pain, or any new, worsening, or concerning symptoms.

## 2019-12-30 NOTE — ED Notes (Signed)
Patient transported to Ultrasound 

## 2019-12-30 NOTE — ED Triage Notes (Addendum)
Pt to triage via [facility] from home.  C/o sudden onset of upper abd pain with nausea and vomiting since 8am.  Pt had ran outside "unclothed" on EMS arrival.

## 2019-12-30 NOTE — ED Provider Notes (Signed)
[facility] EMERGENCY DEPARTMENT Provider Note   CSN: [id number] Arrival date & time: 12/30/19  4166     History Chief Complaint  Patient presents with  . Abdominal Pain    Lisa Zimmerman is a 29 y.o. female presenting for evaluation of abdominal pain, nausea, vomiting.  Patient states she was awoken from sleep around 8 AM this morning with severe upper abdominal burning pain.  She states pain is constant, however has improved slightly since 8 AM.  She reports 3-4 episodes of emesis at home, probably called EMS.  She vomited 1-2 more times with EMS.  Patient states she has not taken anything for her symptoms.  She tried to take Tums and Pepto-Bismol, however was not able to keep it down.  Patient reports he feels chilled now, but denies any fevers.  She denies chest pain, shortness breath, cough, lower abdominal pain, urinary symptoms, abnormal bowel movements.  She denies vaginal bleeding or vaginal discharge.  Patient is 1 month postpartum, currently breast-feeding.  She reports occasional tobacco, alcohol, and marijuana use.  She denies any alcohol or marijuana in the past several days.  She reports no history of abdominal problems.  Has never had abdominal surgery.  She has not had anything to eat or drink today.  Additional history obtained chart review.  Patient was seen 5 months ago during her pregnancy, had an MRI which showed 2 lesions on her liver.  She has not followed up on this yet.  HPI     Past Medical History:  Diagnosis Date  . Anemia   . Eczema of both hands     Patient Active Problem List   Diagnosis Date Noted  . [redacted] weeks gestation of pregnancy 10/30/2019  . Labor and delivery, indication for care 10/30/2019  . Supervision of other normal pregnancy, antepartum 09/06/2019    Past Surgical History:  Procedure Laterality Date  . sinus polyp removal    . WISDOM TOOTH EXTRACTION  04/2018     OB History    Gravida  2   Para  1   Term  1     Preterm      AB  1   Living  1     SAB      TAB      Ectopic      Multiple  0   Live Births  1           Family History  Problem Relation Age of Onset  . Hypertension Father   . Heart disease Father   . Seizures Brother     Social History   Tobacco Use  . Smoking status: Passive Smoke Exposure - Never Smoker  . Smokeless tobacco: Never Used  Substance Use Topics  . Alcohol use: Not Currently  . Drug use: Not Currently    Types: Marijuana    Home Medications Prior to Admission medications   Medication Sig Start Date End Date Taking? Authorizing Provider  acetaminophen (TYLENOL) 500 MG tablet Take 500 mg by mouth every 6 (six) hours as needed for mild pain.    [provider]  calcium carbonate (TUMS - DOSED IN MG ELEMENTAL CALCIUM) 500 MG chewable tablet Chew 2 tablets by mouth as needed for indigestion or heartburn.    [provider]  ferrous sulfate 325 (65 FE) MG tablet Take 1 tablet (325 mg total) by mouth every other day. 10/31/19   Clarnce Flock, MD  ibuprofen (ADVIL) 600 MG  tablet Take 1 tablet (600 mg total) by mouth every 6 (six) hours. 10/31/19   Venora Maples, MD  Prenatal Vit-Fe Phos-FA-Omega (VITAFOL GUMMIES) 3.33-0.333-34.8 MG CHEW Chew 3 each by mouth daily. Patient not taking: Reported on 11/30/2019 10/19/19   Raelyn Mora, CNM    Allergies    Patient has no known allergies.  Review of Systems   Review of Systems  Gastrointestinal: Positive for abdominal pain, nausea and vomiting.  All other systems reviewed and are negative.   Physical Exam Updated Vital Signs BP 125/80 (BP Location: Left Arm)   Pulse 77   Temp 97.8 F (36.6 C) (Oral)   Resp 14   LMP 02/10/2019 (Approximate)   SpO2 100%   Physical Exam Vitals and nursing note reviewed.  Constitutional:      General: She is not in acute distress.    Appearance: She is well-developed.     Comments: Resting comfortably in bed no acute distress   HENT:     Head: Normocephalic and atraumatic.  Eyes:     Extraocular Movements: Extraocular movements intact.     Conjunctiva/sclera: Conjunctivae normal.     Pupils: Pupils are equal, round, and reactive to light.  Cardiovascular:     Rate and Rhythm: Normal rate and regular rhythm.     Pulses: Normal pulses.  Pulmonary:     Effort: Pulmonary effort is normal. No respiratory distress.     Breath sounds: Normal breath sounds. No wheezing.  Abdominal:     General: There is no distension.     Palpations: Abdomen is soft. There is no mass.     Tenderness: There is abdominal tenderness in the right upper quadrant, epigastric area and left upper quadrant. There is no guarding or rebound. Positive signs include Murphy's sign.     Comments: Tenderness palpation of the upper abdomen.  Positive Murphy's.  No rigidity, guarding, distention.  Negative rebound.  No signs of peritonitis.  No CVA tenderness.  Musculoskeletal:        General: Normal range of motion.     Cervical back: Normal range of motion and neck supple.  Skin:    General: Skin is warm and dry.     Capillary Refill: Capillary refill takes less than 2 seconds.  Neurological:     Mental Status: She is alert and oriented to person, place, and time.     ED Results / Procedures / Treatments   Labs (all labs ordered are listed, but only abnormal results are displayed) Labs Reviewed  COMPREHENSIVE METABOLIC PANEL - Abnormal; Notable for the following components:      Result Value   Potassium 3.3 (*)    Glucose, Bld 120 (*)    AST 113 (*)    ALT 59 (*)    All other components within normal limits  CBC - Abnormal; Notable for the following components:   WBC 15.7 (*)    All other components within normal limits  LIPASE, BLOOD  URINALYSIS, ROUTINE W REFLEX MICROSCOPIC  RAPID URINE DRUG SCREEN, HOSP PERFORMED  I-STAT BETA HCG BLOOD, ED ([facility], [facility], [facility] ONLY)    EKG None  Radiology No results found.  Procedures Procedures  (including critical care time)  Medications Ordered in ED Medications  sodium chloride flush (NS) 0.9 % injection 3 mL (has no administration in time range)  famotidine (PEPCID) IVPB 20 mg premix (has no administration in time range)  ondansetron (ZOFRAN) injection 4 mg (has no administration in time range)  ED Course  I have reviewed the triage vital signs and the nursing notes.  Pertinent labs & imaging results that were available during my care of the patient were reviewed by me and considered in my medical decision making (see chart for details).    MDM Rules/Calculators/A&P                      Patient presenting for evaluation of nausea, vomiting, abdominal pain.  Physical exam shows patient appears nontoxic.  She does have tenderness with palpation of the upper abdomen, and a positive Murphy sign.  Consider gallstones.  Also consider pancreatitis, PUD, gastritis.  Will obtain labs and right upper quadrant ultrasound for further evaluation.  Pepcid and Zofran for symptom control.  Labs show mild leukocytosis at 15.  AST and ALT mildly elevated, but bili is normal.  Lipase is normal, doubt pancreatitis.  Right upper quadrant shows multiple gallstones without gallbladder wall thickening or signs of cholecystitis.  On reassessment, pain is improved.  She is tolerating p.o.  Discussed finding of gallstones and further management.  Discussed importance of diet control, symptomatic control medication, and follow-up with general surgery for further management.  At this time, patient appears safe for discharge.  Return precautions given.  Patient states she understands and agrees to plan.  Final Clinical Impression(s) / ED Diagnoses Final diagnoses:  Upper abdominal pain    Rx / DC Orders ED Discharge Orders    None       Alveria Apley, PA-C 12/30/19 1508    Margarita Grizzle, MD 12/31/19 1512

## 2020-01-08 ENCOUNTER — Ambulatory Visit: Payer: Medicaid Other | Admitting: Gastroenterology

## 2020-01-08 NOTE — Progress Notes (Deleted)
Referring Provider: Raelyn Mora, CNM Primary Care Physician:  Patient, No Pcp Per  Reason for Consultation: Liver lesion   IMPRESSION:  Abnormal MRI    - MRI without contrast 07/13/2019: 2.7 cm lesion in segment 7 and a 9 mm lesion seen in the central left hepatic lobe adjacent to the left portal vein    - Abdominal ultrasound 12/30/2019: Cholelithiasis without evidence of cholecystitis.  No focal liver lesions identified.  PLAN: MRI of the liver with and without contrast  Please see the "Patient Instructions" section for addition details about the plan.  HPI: Lisa Zimmerman is a 29 y.o. female    MRI without contrast 07/13/2019: 2.7 cm lesion in segment 7 and a 9 mm lesion seen in the central left hepatic lobe adjacent to the left portal vein.  The lesions could not be characterized on MRI.  Abdominal ultrasound 12/30/2019: Cholelithiasis without evidence of cholecystitis.  No focal liver lesions identified.  No known family history of colon cancer or polyps. No family history of uterine/endometrial cancer, pancreatic cancer or gastric/stomach cancer.   Past Medical History:  Diagnosis Date  . Anemia   . Eczema of both hands     Past Surgical History:  Procedure Laterality Date  . sinus polyp removal    . WISDOM TOOTH EXTRACTION  04/2018    Current Outpatient Medications  Medication Sig Dispense Refill  . acetaminophen (TYLENOL) 500 MG tablet Take 500 mg by mouth every 6 (six) hours as needed for mild pain.    . calcium carbonate (TUMS - DOSED IN MG ELEMENTAL CALCIUM) 500 MG chewable tablet Chew 2 tablets by mouth as needed for indigestion or heartburn.    . ferrous sulfate 325 (65 FE) MG tablet Take 1 tablet (325 mg total) by mouth every other day. 30 tablet 0  . HYDROcodone-acetaminophen (NORCO/VICODIN) 5-325 MG tablet Take 1 tablet by mouth every 6 (six) hours as needed. 8 tablet 0  . ibuprofen (ADVIL) 600 MG tablet Take 1 tablet (600 mg total) by mouth every 6 (six)  hours. 30 tablet 0  . ondansetron (ZOFRAN) 4 MG tablet Take 1 tablet (4 mg total) by mouth every 8 (eight) hours as needed for nausea or vomiting. 12 tablet 0  . Prenatal Vit-Fe Phos-FA-Omega (VITAFOL GUMMIES) 3.33-0.333-34.8 MG CHEW Chew 3 each by mouth daily. (Patient not taking: Reported on 11/30/2019) 90 tablet 12   No current facility-administered medications for this visit.    Allergies as of 01/08/2020  . (No Known Allergies)    Family History  Problem Relation Age of Onset  . Hypertension Father   . Heart disease Father   . Seizures Brother     Social History   Socioeconomic History  . Marital status: Single    Spouse name: Not on file  . Number of children: Not on file  . Years of education: [employer]  . Highest education level: Associate degree: occupational, Scientist, product/process development, or vocational program  Occupational History  . Not on file  Tobacco Use  . Smoking status: Passive Smoke Exposure - Never Smoker  . Smokeless tobacco: Never Used  Substance and Sexual Activity  . Alcohol use: Not Currently  . Drug use: Not Currently    Types: Marijuana  . Sexual activity: Yes    Birth control/protection: None  Other Topics Concern  . Not on file  Social History Narrative  . Not on file   Social Determinants of Health   Financial Resource Strain:   . Difficulty  of Paying Living Expenses: Not on file  Food Insecurity: No Food Insecurity  . Worried About Charity fundraiser in the Last Year: Never true  . Ran Out of Food in the Last Year: Never true  Transportation Needs: No Transportation Needs  . Lack of Transportation (Medical): No  . Lack of Transportation (Non-Medical): No  Physical Activity: Inactive  . Days of Exercise per Week: 0 days  . Minutes of Exercise per Session: 0 min  Stress:   . Feeling of Stress : Not on file  Social Connections: Unknown  . Frequency of Communication with Friends and Family: Not on file  . Frequency of Social Gatherings with Friends  and Family: Not on file  . Attends Religious Services: Not on file  . Active Member of Clubs or Organizations: Not on file  . Attends Archivist Meetings: Not on file  . Marital Status: Never married  Intimate Partner Violence: Not At Risk  . Fear of Current or Ex-Partner: No  . Emotionally Abused: No  . Physically Abused: No  . Sexually Abused: No    Review of Systems: 12 system ROS is negative except as noted above.   Physical Exam: General:   Alert,  well-nourished, pleasant and cooperative in NAD Head:  Normocephalic and atraumatic. Eyes:  Sclera clear, no icterus.   Conjunctiva pink. Ears:  Normal auditory acuity. Nose:  No deformity, discharge,  or lesions. Mouth:  No deformity or lesions.   Neck:  Supple; no masses or thyromegaly. Lungs:  Clear throughout to auscultation.   No wheezes. Heart:  Regular rate and rhythm; no murmurs. Abdomen:  Soft,nontender, nondistended, normal bowel sounds, no rebound or guarding. No hepatosplenomegaly.   Rectal:  Deferred  Msk:  Symmetrical. No boney deformities LAD: No inguinal or umbilical LAD Extremities:  No clubbing or edema. Neurologic:  Alert and  oriented x4;  grossly nonfocal Skin:  Intact without significant lesions or rashes. Psych:  Alert and cooperative. Normal mood and affect.     Per Beagley L. Tarri Glenn, MD, MPH 01/08/2020, 8:28 AM

## 2020-01-16 ENCOUNTER — Other Ambulatory Visit: Payer: Self-pay

## 2020-01-16 ENCOUNTER — Ambulatory Visit (INDEPENDENT_AMBULATORY_CARE_PROVIDER_SITE_OTHER): Payer: Medicaid Other | Admitting: *Deleted

## 2020-01-16 VITALS — BP 126/77 | HR 71 | Temp 98.1°F | Wt 147.0 lb

## 2020-01-16 DIAGNOSIS — Z3042 Encounter for surveillance of injectable contraceptive: Secondary | ICD-10-CM | POA: Diagnosis not present

## 2020-01-16 MED ORDER — MEDROXYPROGESTERONE ACETATE 150 MG/ML IM SUSP
150.0000 mg | INTRAMUSCULAR | Status: DC
  Administered 2020-01-16 – 2022-10-08 (×9): 150 mg via INTRAMUSCULAR

## 2020-01-16 NOTE — Progress Notes (Signed)
   Subjective:  Pt in for Depo Provera injection.    Objective: Need for contraception. No unusual complaints.    Assessment: Pt tolerated Depo injection. Depo given left upper outer quadrant.   Plan:  Next injection due April 6-20, 2021.    Clovis Pu, RN

## 2020-01-24 ENCOUNTER — Ambulatory Visit: Payer: Self-pay | Admitting: General Surgery

## 2020-02-02 HISTORY — PX: CHOLECYSTECTOMY: SHX55

## 2020-02-16 IMAGING — DX ABDOMEN - 1 VIEW
1 series · 1 of 1 positions shown · non-contrast
Comparison: Same-day renal ultrasound

CLINICAL DATA: Right flank pain

EXAM:
ABDOMEN - 1 VIEW

[abdomen kub]
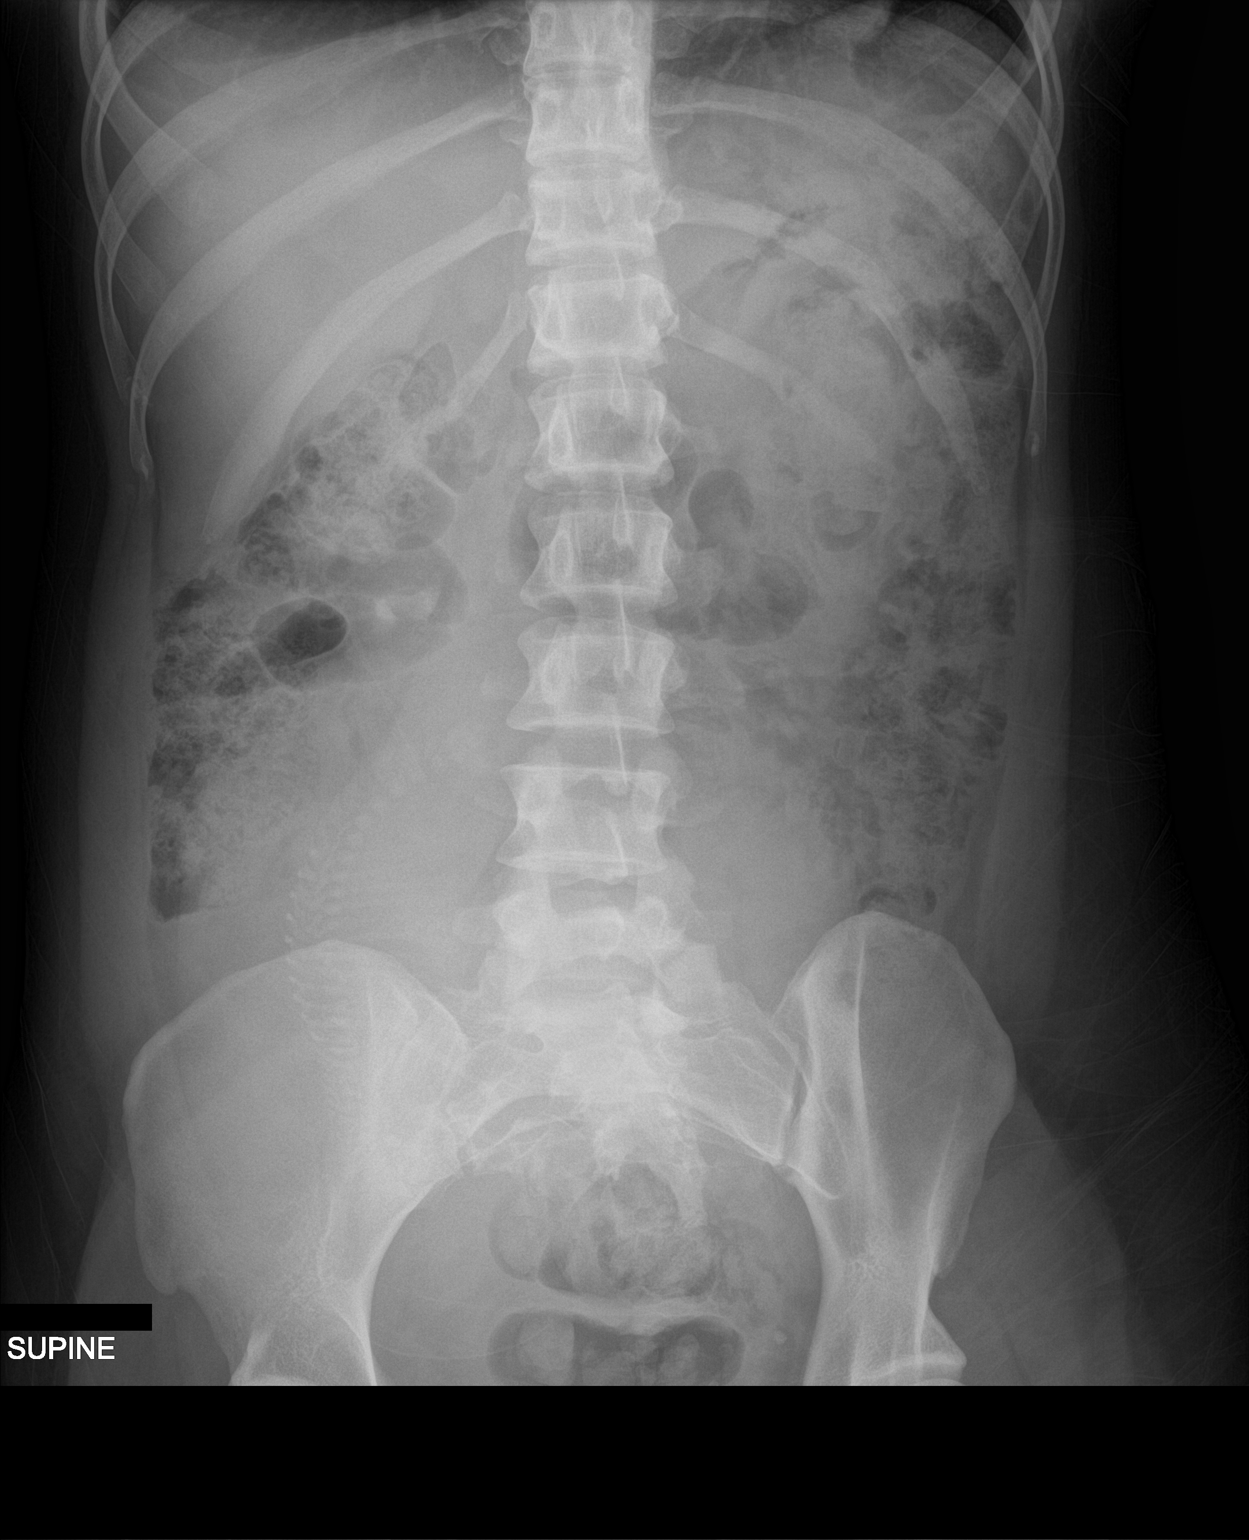

[1 of 1 positions shown; findings below may reference images not displayed]

FINDINGS: Nonobstructive bowel gas pattern. Moderate to large colonic stool
burden. No gross free intraperitoneal air. No pathologic
calcification identified to suggest nephrolithiasis. Fetal skeleton
projects over the right hemiabdomen.
IMPRESSION: Moderate to large colonic stool burden.

Fetal skeleton in situ.

## 2020-04-02 ENCOUNTER — Ambulatory Visit: Payer: Medicaid Other

## 2020-04-09 ENCOUNTER — Ambulatory Visit: Payer: Medicaid Other | Admitting: *Deleted

## 2020-04-09 ENCOUNTER — Other Ambulatory Visit: Payer: Self-pay

## 2020-04-09 DIAGNOSIS — Z3042 Encounter for surveillance of injectable contraceptive: Secondary | ICD-10-CM

## 2020-04-09 NOTE — Progress Notes (Signed)
   Subjective:  Pt in for Depo Provera injection.    Objective: Need for contraception. No unusual complaints.    Assessment: Pt tolerated Depo injection. Depo given Left Deltoid.   Plan:  Next injection due June 25, 2020-July 09, 2020.    Clovis Pu, RN

## 2020-06-25 ENCOUNTER — Other Ambulatory Visit: Payer: Self-pay

## 2020-06-25 ENCOUNTER — Ambulatory Visit (INDEPENDENT_AMBULATORY_CARE_PROVIDER_SITE_OTHER): Payer: Medicaid Other | Admitting: *Deleted

## 2020-06-25 VITALS — BP 129/85 | HR 82 | Temp 98.2°F | Ht 64.0 in | Wt 146.8 lb

## 2020-06-25 DIAGNOSIS — Z3042 Encounter for surveillance of injectable contraceptive: Secondary | ICD-10-CM

## 2020-06-25 NOTE — Progress Notes (Signed)
   Subjective:  Pt in for Depo Provera injection.    Objective: Need for contraception. No unusual complaints.    Assessment: Pt tolerated Depo injection. Depo given Right upper outer quadrant.   Plan:  Next injection due Sept 14-28, 2021.    Clovis Pu, RN

## 2020-08-04 IMAGING — US US ABDOMEN LIMITED
2 series · 14 of 25 positions shown · non-contrast
Comparison: None.

CLINICAL DATA: Upper abdominal pain

EXAM:
ULTRASOUND ABDOMEN LIMITED RIGHT UPPER QUADRANT

[Series 1: us abdomen limited · 13 of 63 slices shown (1 of 2)]
[im 1/63]
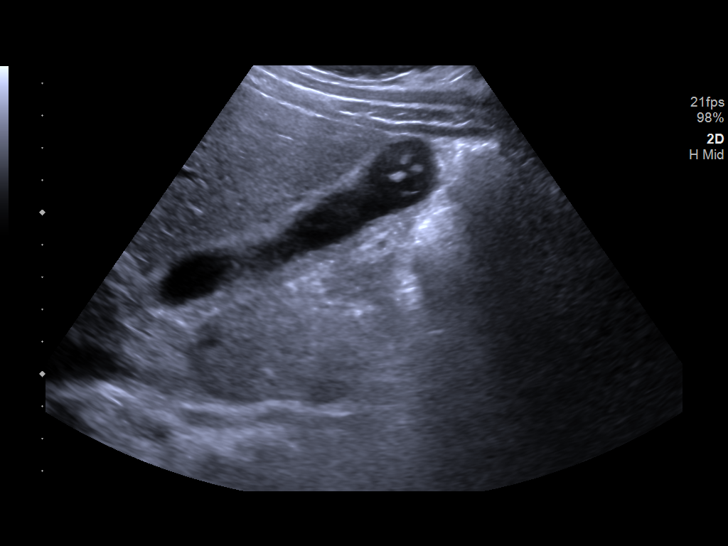
[im 6/63]
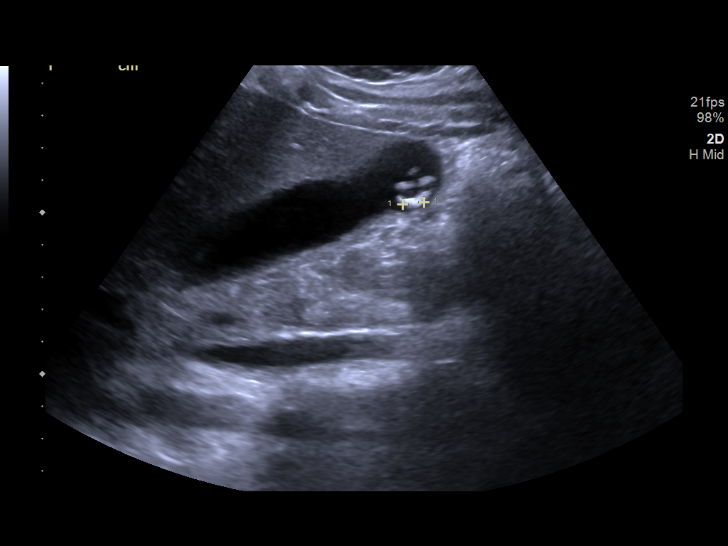
[im 11/63]
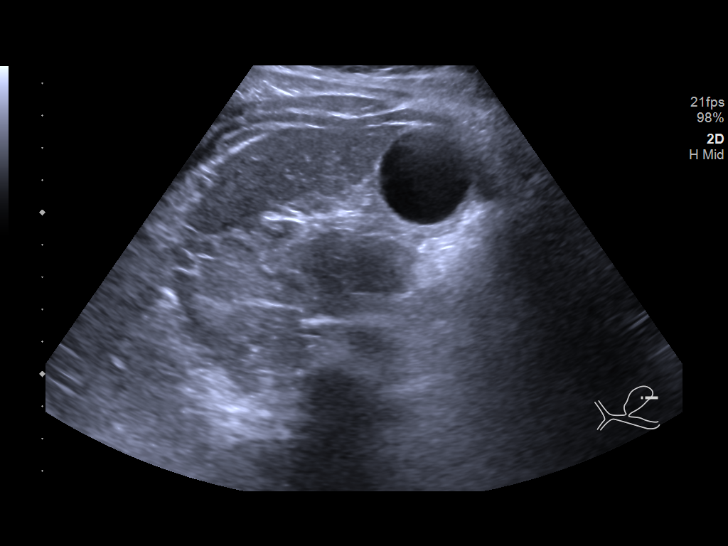
[im 17/63]
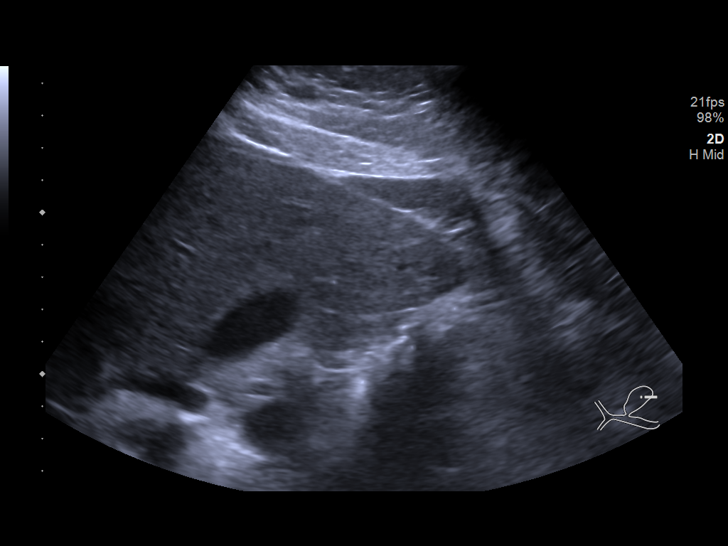
[im 22/63]
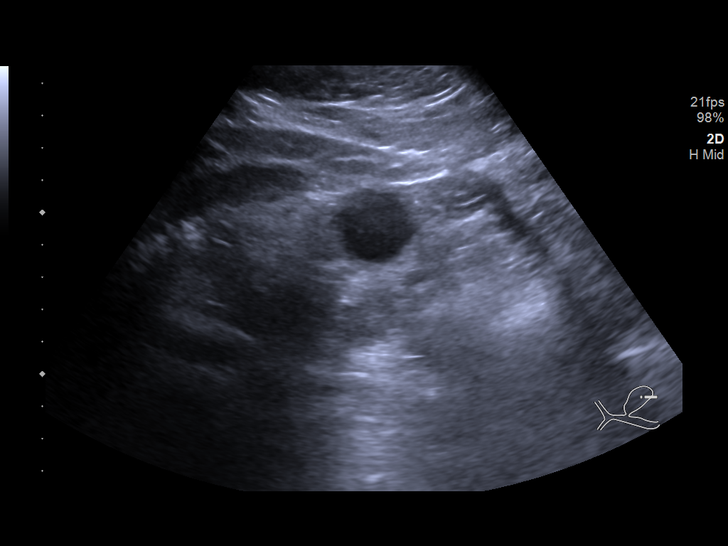
[im 25/63]
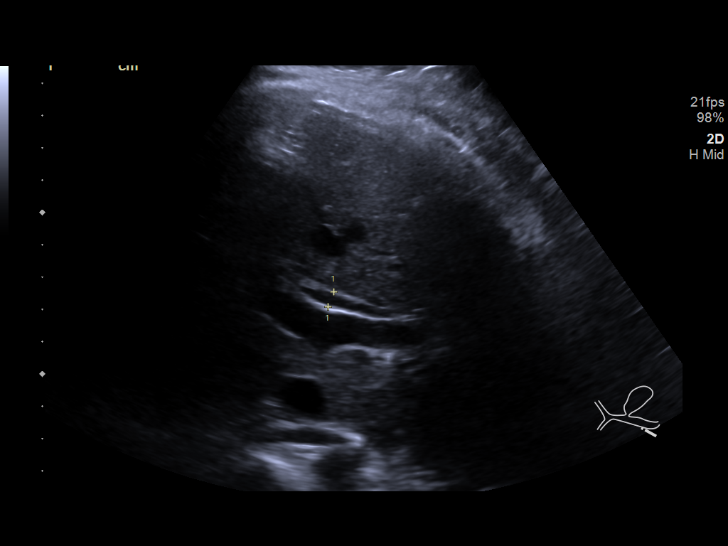
[im 30/63]
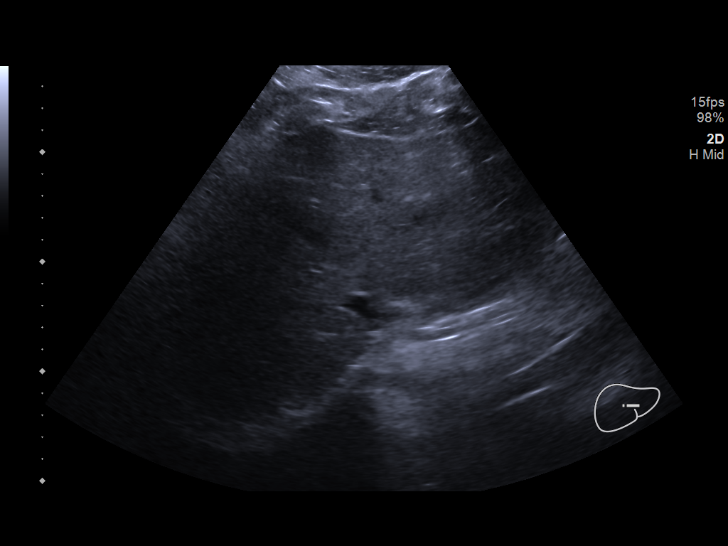
[im 36/63]
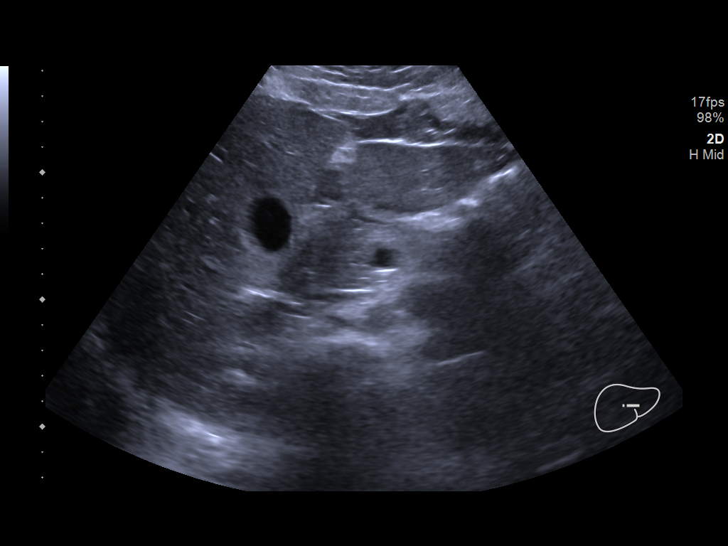
[im 41/63]
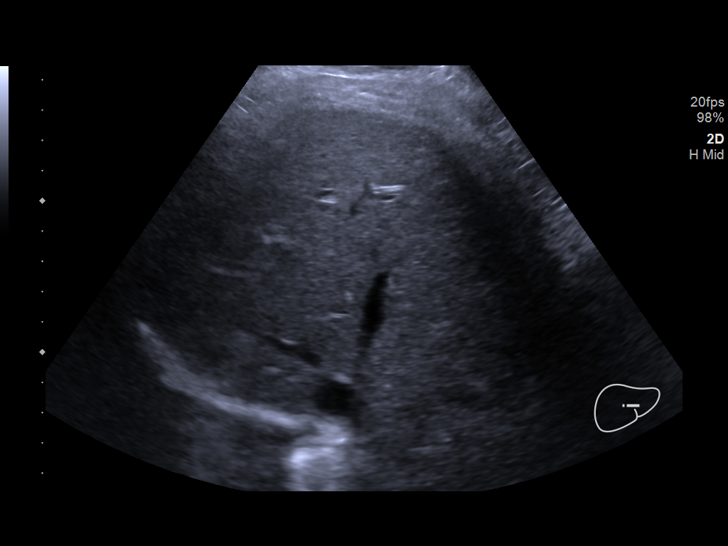
[im 44/63]
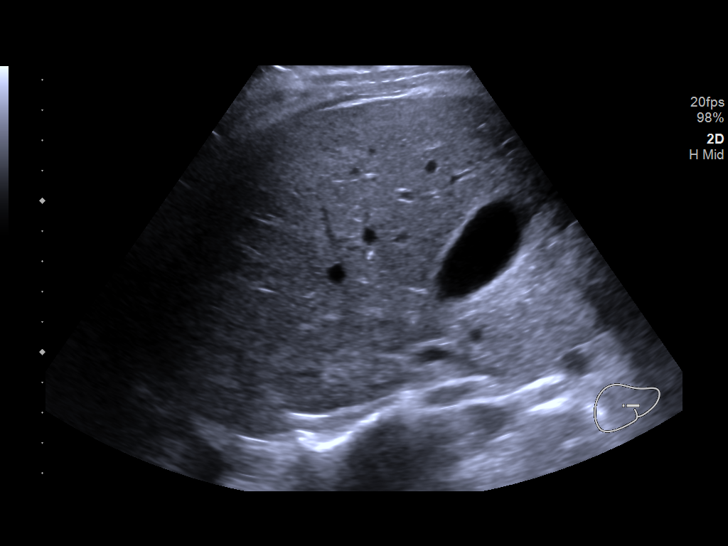
[im 49/63]
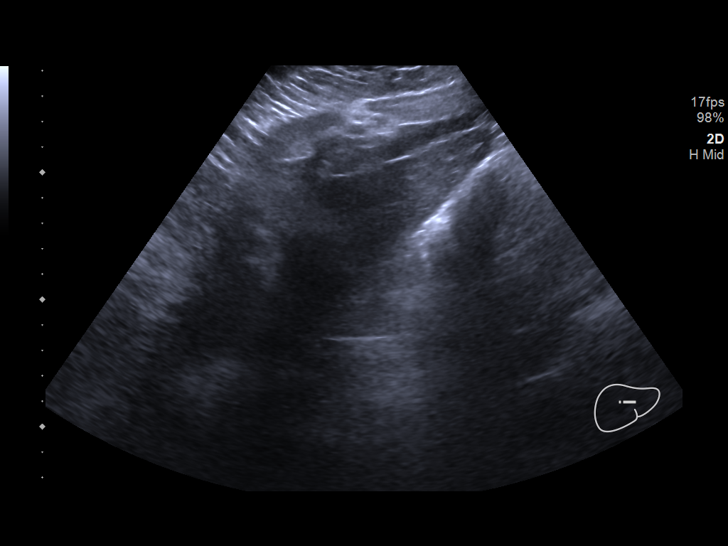
[im 54/63]
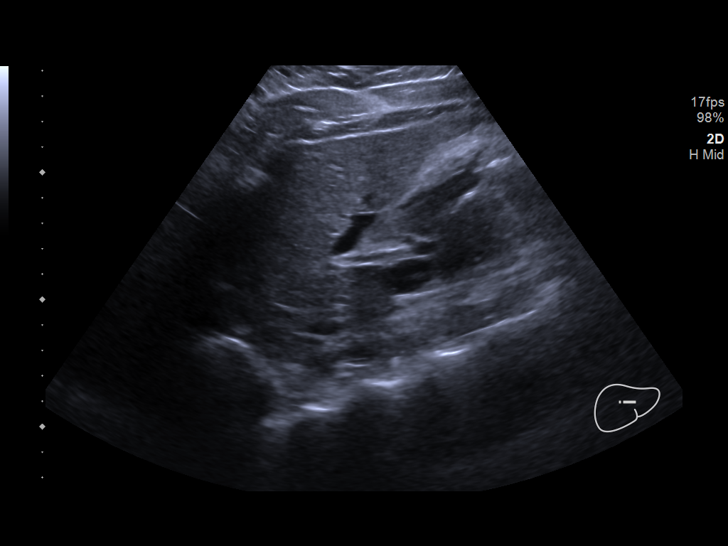
[im 60/63]
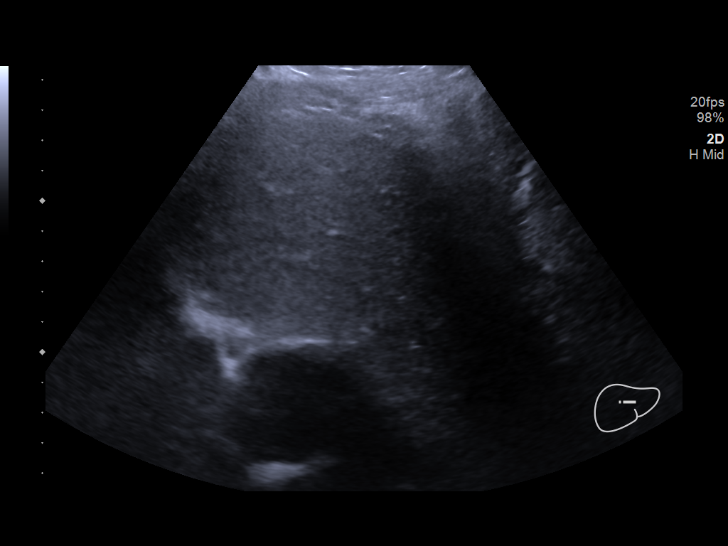

[Series 2: us abdomen limited · 1 of 1 slices shown (2 of 2)]
[im 1/1]
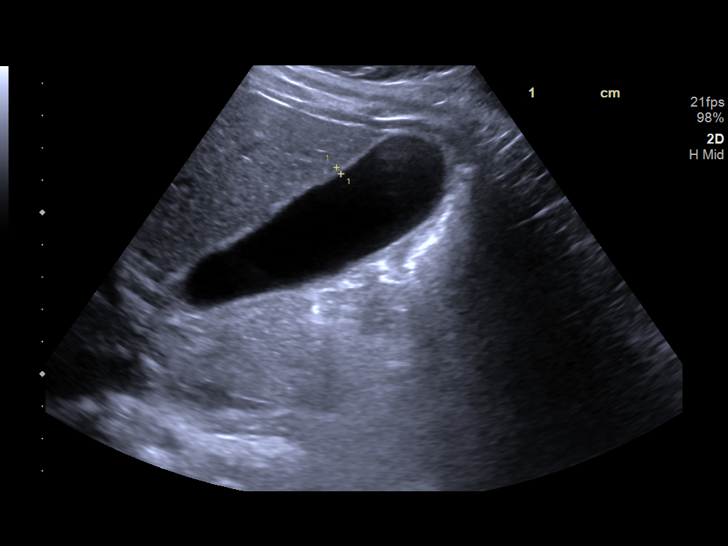

[14 of 25 positions shown; findings below may reference images not displayed]

FINDINGS: Gallbladder:

There are multiple gallstones, largest measuring 0.7 cm. No
gallbladder wall thickening. No sonographic Murphy sign noted by
sonographer.

Common bile duct:

Diameter: 0.6 cm

Liver:

No focal lesion identified. Within normal limits in parenchymal
echogenicity. Portal vein is patent on color Doppler imaging with
normal direction of blood flow towards the liver.

Other: None.
IMPRESSION: Cholelithiasis without evidence of cholecystitis.

## 2020-09-10 ENCOUNTER — Ambulatory Visit: Payer: Medicaid Other

## 2020-09-10 ENCOUNTER — Ambulatory Visit (INDEPENDENT_AMBULATORY_CARE_PROVIDER_SITE_OTHER): Payer: Medicaid Other | Admitting: *Deleted

## 2020-09-10 ENCOUNTER — Other Ambulatory Visit: Payer: Self-pay

## 2020-09-10 DIAGNOSIS — Z3042 Encounter for surveillance of injectable contraceptive: Secondary | ICD-10-CM

## 2020-09-10 NOTE — Progress Notes (Signed)
   Subjective:  Pt in for Depo Provera injection.    Objective: Need for contraception. No unusual complaints.    Assessment: Pt tolerated Depo injection. Depo given Left Deltoid.  Plan:  Next injection due 11/26/20-12/10/20.    Clovis Pu, RN

## 2020-11-18 ENCOUNTER — Telehealth: Payer: Self-pay | Admitting: *Deleted

## 2020-11-18 NOTE — Telephone Encounter (Signed)
Left voice message for patient to return nurse call to schedule Depo Provera appointment on 11/26/20.  Clovis Pu, RN

## 2020-11-25 ENCOUNTER — Telehealth: Payer: Self-pay | Admitting: General Practice

## 2020-11-25 NOTE — Telephone Encounter (Signed)
Called patient to reschedule appt for Depo.  Left message on VM informing pt was rescheduled for 11/29/2020 at 1015 and asked pt to give our office a call to confirm.

## 2020-11-25 NOTE — Telephone Encounter (Signed)
Left voice message for patient to return nurse call.  Dejae Bernet L, RN  

## 2020-11-26 ENCOUNTER — Ambulatory Visit: Payer: Medicaid Other

## 2020-11-29 ENCOUNTER — Ambulatory Visit: Payer: Medicaid Other

## 2020-12-04 ENCOUNTER — Ambulatory Visit (INDEPENDENT_AMBULATORY_CARE_PROVIDER_SITE_OTHER): Payer: Medicaid Other | Admitting: *Deleted

## 2020-12-04 ENCOUNTER — Other Ambulatory Visit: Payer: Self-pay

## 2020-12-04 DIAGNOSIS — Z3042 Encounter for surveillance of injectable contraceptive: Secondary | ICD-10-CM

## 2020-12-04 NOTE — Progress Notes (Signed)
   Subjective:  Pt in for Depo Provera injection.    Objective: Need for contraception. No unusual complaints.    Assessment: Pt tolerated Depo injection. Depo given Left Deltoid.  Plan:  Next injection due Feb. 23-March 05, 2021.    Clovis Pu, RN

## 2021-01-17 ENCOUNTER — Other Ambulatory Visit: Payer: Medicaid Other

## 2021-01-20 ENCOUNTER — Other Ambulatory Visit: Payer: Medicaid Other

## 2021-01-20 DIAGNOSIS — Z20822 Contact with and (suspected) exposure to covid-19: Secondary | ICD-10-CM

## 2021-01-21 LAB — NOVEL CORONAVIRUS, NAA: SARS-CoV-2, NAA: NOT DETECTED

## 2021-01-21 LAB — SARS-COV-2, NAA 2 DAY TAT

## 2021-02-21 ENCOUNTER — Ambulatory Visit: Payer: Medicaid Other | Admitting: Advanced Practice Midwife

## 2021-02-24 ENCOUNTER — Ambulatory Visit: Payer: Medicaid Other

## 2021-03-04 ENCOUNTER — Other Ambulatory Visit: Payer: Self-pay

## 2021-03-04 ENCOUNTER — Ambulatory Visit (INDEPENDENT_AMBULATORY_CARE_PROVIDER_SITE_OTHER): Payer: Medicaid Other | Admitting: *Deleted

## 2021-03-04 DIAGNOSIS — Z3042 Encounter for surveillance of injectable contraceptive: Secondary | ICD-10-CM

## 2021-03-04 NOTE — Progress Notes (Signed)
   Subjective:  Pt in for Depo Provera injection.    Objective: Need for contraception. No unusual complaints.    Assessment: Pt tolerated Depo injection. Depo given Left Deltoird   Plan:  Next injection due May 24-June 03, 2021.    Clovis Pu, RN

## 2021-03-28 ENCOUNTER — Ambulatory Visit: Payer: Medicaid Other

## 2021-05-14 ENCOUNTER — Other Ambulatory Visit: Payer: Self-pay | Admitting: *Deleted

## 2021-05-14 DIAGNOSIS — Z3042 Encounter for surveillance of injectable contraceptive: Secondary | ICD-10-CM

## 2021-05-14 MED ORDER — MEDROXYPROGESTERONE ACETATE 150 MG/ML IM SUSP: 150.0000 mg | INTRAMUSCULAR | 0 refills | Status: DC

## 2021-05-14 NOTE — Progress Notes (Signed)
See mychart message.  Clovis Pu, RN

## 2021-05-20 ENCOUNTER — Ambulatory Visit: Payer: Medicaid Other

## 2021-05-27 ENCOUNTER — Other Ambulatory Visit: Payer: Self-pay

## 2021-05-27 ENCOUNTER — Ambulatory Visit (INDEPENDENT_AMBULATORY_CARE_PROVIDER_SITE_OTHER): Payer: Medicaid Other | Admitting: *Deleted

## 2021-05-27 DIAGNOSIS — Z3042 Encounter for surveillance of injectable contraceptive: Secondary | ICD-10-CM

## 2021-05-27 NOTE — Progress Notes (Signed)
   Subjective:  Pt in for Depo Provera injection.    Objective: Need for contraception. No unusual complaints.    Assessment: Pt tolerated Depo injection. Depo given Left upper outer quadrant.   Plan:  Next injection due 08/12/21-08/26/21.  Need annual exam.  Clovis Pu, RN

## 2021-08-01 DIAGNOSIS — L309 Dermatitis, unspecified: Secondary | ICD-10-CM | POA: Insufficient documentation

## 2021-08-01 DIAGNOSIS — J302 Other seasonal allergic rhinitis: Secondary | ICD-10-CM | POA: Insufficient documentation

## 2021-08-12 ENCOUNTER — Other Ambulatory Visit: Payer: Self-pay | Admitting: Obstetrics and Gynecology

## 2021-08-12 DIAGNOSIS — Z3042 Encounter for surveillance of injectable contraceptive: Secondary | ICD-10-CM

## 2021-08-13 ENCOUNTER — Ambulatory Visit: Payer: Medicaid Other | Admitting: Student

## 2021-08-28 ENCOUNTER — Other Ambulatory Visit: Payer: Self-pay

## 2021-08-28 ENCOUNTER — Ambulatory Visit (INDEPENDENT_AMBULATORY_CARE_PROVIDER_SITE_OTHER): Payer: Medicaid Other | Admitting: Obstetrics and Gynecology

## 2021-08-28 ENCOUNTER — Encounter: Payer: Self-pay | Admitting: Obstetrics and Gynecology

## 2021-08-28 VITALS — BP 138/82 | HR 125 | Temp 98.2°F | Ht 63.0 in | Wt 158.6 lb

## 2021-08-28 DIAGNOSIS — Z3202 Encounter for pregnancy test, result negative: Secondary | ICD-10-CM

## 2021-08-28 DIAGNOSIS — Z3042 Encounter for surveillance of injectable contraceptive: Secondary | ICD-10-CM | POA: Diagnosis not present

## 2021-08-28 DIAGNOSIS — Z0000 Encounter for general adult medical examination without abnormal findings: Secondary | ICD-10-CM

## 2021-08-28 LAB — POCT URINE PREGNANCY: Preg Test, Ur: NEGATIVE

## 2021-08-28 MED ORDER — MEDROXYPROGESTERONE ACETATE 150 MG/ML IM SUSP: 150.0000 mg | INTRAMUSCULAR | 3 refills | Status: DC

## 2021-08-28 NOTE — Progress Notes (Signed)
   WELL-WOMAN PHYSICAL & PAP Patient name: Lisa Zimmerman MRN [id number]  Date of birth: 02/20/1991 Chief Complaint:   No chief complaint on file.  History of Present Illness:   Lisa Zimmerman is a 30 y.o. G31P1011 African American female being seen today for a routine well-woman exam.  Current complaints: none  PCP: none      does not desire labs No LMP recorded. Patient has had an injection. The current method of family planning is Depo-Provera injections.  Last pap 06/28/2019. Results were: normal Last mammogram: n/a. Results were:  n/a . Family h/o breast cancer: No Last colonoscopy: n/a. Results were:  n/a . Family h/o colorectal cancer: No Review of Systems:   Pertinent items are noted in HPI Denies any headaches, blurred vision, fatigue, shortness of breath, chest pain, abdominal pain, abnormal vaginal discharge/itching/odor/irritation, problems with periods, bowel movements, urination, or intercourse unless otherwise stated above. Pertinent History Reviewed:  Reviewed past medical,surgical, social and family history.  Reviewed problem list, medications and allergies. Physical Assessment:   Vitals:   08/28/21 1522  BP: 138/82  Pulse: (!) 125  Temp: 98.2 F (36.8 C)  TempSrc: Oral  Weight: 158 lb 9.6 oz (71.9 kg)  Height: 5\' 3"  (1.6 m)  Body mass index is 28.09 kg/m.        Physical Examination:   General appearance - well appearing, and in no distress  Mental status - alert, oriented to person, place, and time  Psych:  She has a normal mood and affect  Skin - warm and dry, normal color, no suspicious lesions noted  Chest - effort normal, all lung fields clear to auscultation bilaterally  Heart - normal rate and regular rhythm  Neck:  midline trachea, no thyromegaly or nodules  Breasts - breasts appear normal, no suspicious masses, no skin or nipple changes or  axillary nodes  Abdomen - soft, nontender, nondistended, no masses or organomegaly  Pelvic -  deferred  Extremities:  No swelling or varicosities noted  Results for orders placed or performed in visit on 08/28/21 (from the past 24 hour(s))  POCT urine pregnancy   Collection Time: 08/28/21  3:35 PM  Result Value Ref Range   Preg Test, Ur Negative Negative    Assessment & Plan:  1) Well-Woman Exam without Pap  2) Encounter for annual physical examination excluding gynecological examination in a patient older than 17 years - Plan: POCT urine pregnancy  3) Family planning, Depo-Provera contraception monitoring/administration  - medroxyPROGESTERone (DEPO-PROVERA) 150 MG/ML injection,  - POCT urine pregnancy   Labs/procedures today: Depo injection  Mammogram at age 68 or sooner if problems Colonoscopy at age 43 or sooner if problems  Orders Placed This Encounter  Procedures   POCT urine pregnancy    Meds:  Meds ordered this encounter  Medications   medroxyPROGESTERone (DEPO-PROVERA) 150 MG/ML injection    Sig: Inject 1 mL (150 mg total) into the muscle every 3 (three) months.    Dispense:  1 mL    Refill:  3    Follow-up: No follow-ups on file.  54 MSN, CNM 08/28/2021

## 2021-08-28 NOTE — Progress Notes (Signed)
   Subjective:  Pt in for Depo Provera injection.    Objective: Need for contraception. No unusual complaints.    Assessment: Pt tolerated Depo injection. Depo given Right Deltoid.  Plan:  Next injection due 11/13/21-11/27/21.    Clovis Pu, RN

## 2021-11-25 ENCOUNTER — Encounter: Payer: Self-pay | Admitting: Obstetrics and Gynecology

## 2021-11-27 ENCOUNTER — Ambulatory Visit: Payer: Medicaid Other

## 2021-12-01 ENCOUNTER — Ambulatory Visit: Payer: Medicaid Other

## 2021-12-01 ENCOUNTER — Ambulatory Visit (INDEPENDENT_AMBULATORY_CARE_PROVIDER_SITE_OTHER): Payer: Medicaid Other | Admitting: *Deleted

## 2021-12-01 ENCOUNTER — Other Ambulatory Visit: Payer: Self-pay

## 2021-12-01 VITALS — BP 122/88 | HR 86 | Temp 98.0°F | Ht 63.0 in | Wt 167.6 lb

## 2021-12-01 DIAGNOSIS — Z3042 Encounter for surveillance of injectable contraceptive: Secondary | ICD-10-CM

## 2021-12-01 DIAGNOSIS — Z3202 Encounter for pregnancy test, result negative: Secondary | ICD-10-CM | POA: Diagnosis not present

## 2021-12-01 LAB — POCT URINE PREGNANCY: Preg Test, Ur: NEGATIVE

## 2021-12-01 MED ORDER — MEDROXYPROGESTERONE ACETATE 150 MG/ML IM SUSP
150.0000 mg | Freq: Once | INTRAMUSCULAR | Status: AC
  Administered 2021-12-01: 150 mg via INTRAMUSCULAR

## 2021-12-01 NOTE — Progress Notes (Signed)
   Subjective:  Patient late for Depo Provera injection. Last given 08/28/2021. Last sexual activity reported over a week or two ago unprotected.  Objective: Need for contraception. No unusual complaints.    Assessment: Negative UPT. Pt tolerated Depo injection. Depo given Right Deltoid.  Plan:  Next injection due 2/20/203-03/02/2022.   Advised to use another reliable birth control method for 7-10 days.  Clovis Pu, RN

## 2022-02-11 ENCOUNTER — Telehealth: Payer: Self-pay | Admitting: Certified Nurse Midwife

## 2022-02-11 NOTE — Telephone Encounter (Signed)
Attempted to reach patient about rescheduling her appointment. Left a message for her to call the office. 

## 2022-02-16 ENCOUNTER — Ambulatory Visit: Payer: Medicaid Other

## 2022-02-19 ENCOUNTER — Other Ambulatory Visit: Payer: Self-pay

## 2022-02-19 ENCOUNTER — Ambulatory Visit (INDEPENDENT_AMBULATORY_CARE_PROVIDER_SITE_OTHER): Payer: Medicaid Other | Admitting: Obstetrics and Gynecology

## 2022-02-19 DIAGNOSIS — Z3042 Encounter for surveillance of injectable contraceptive: Secondary | ICD-10-CM | POA: Diagnosis not present

## 2022-02-19 MED ORDER — MEDROXYPROGESTERONE ACETATE 150 MG/ML IM SUSY: PREFILLED_SYRINGE | Freq: Once | INTRAMUSCULAR | Status: AC

## 2022-02-19 NOTE — Progress Notes (Signed)
° °  NURSE VISIT- INJECTION  SUBJECTIVE:  Lisa Zimmerman is a 31 y.o. G3P1011 female here for a Depo Provera for contraception/period management. She is a GYN patient.   OBJECTIVE:  There were no vitals taken for this visit.  Appears well, in no apparent distress  Injection administered in: Left deltoid  Meds ordered this encounter  Medications   medroxyPROGESTERone Acetate SUSY    ASSESSMENT: GYN patient Depo Provera for contraception/period management PLAN: Follow-up: in 11-13 weeks for next Depo   Lisa Zimmerman A Lisa Zimmerman  02/19/2022 1:39 PM

## 2022-02-19 NOTE — Progress Notes (Addendum)
° °  NURSE VISIT- INJECTION  SUBJECTIVE:  Lisa Zimmerman is Lisa 31 y.o. G59P1011 female here for Lisa Depo Provera for contraception/period management. She is Lisa GYN patient.   OBJECTIVE:  There were no vitals taken for this visit.  Appears well, in no apparent distress  Injection administered in: Left deltoid  Meds ordered this encounter  Medications   medroxyPROGESTERone Acetate SUSY    ASSESSMENT: GYN patient Depo Provera for contraception/period management PLAN: Follow-up: in 11-13 weeks for next Depo   Lisa Zimmerman Lisa Zimmerman  02/19/2022 2:02 PM

## 2022-05-07 ENCOUNTER — Ambulatory Visit (INDEPENDENT_AMBULATORY_CARE_PROVIDER_SITE_OTHER): Payer: Medicaid Other

## 2022-05-07 VITALS — BP 128/89 | HR 79 | Wt 177.6 lb

## 2022-05-07 DIAGNOSIS — Z3042 Encounter for surveillance of injectable contraceptive: Secondary | ICD-10-CM

## 2022-05-07 MED ORDER — MEDROXYPROGESTERONE ACETATE 150 MG/ML IM SUSP
150.0000 mg | Freq: Once | INTRAMUSCULAR | Status: AC
  Administered 2022-05-07: 150 mg via INTRAMUSCULAR

## 2022-05-07 NOTE — Progress Notes (Signed)
Lisa Zimmerman here for Depo-Provera Injection. Injection administered without complication. Patient will return in 3 months for next injection between July 27 and August 10. Next annual visit due September 2023.  ? ?Cline Crock, RN ?05/07/2022   ?

## 2022-07-23 ENCOUNTER — Ambulatory Visit (INDEPENDENT_AMBULATORY_CARE_PROVIDER_SITE_OTHER): Payer: Medicaid Other

## 2022-07-23 ENCOUNTER — Other Ambulatory Visit: Payer: Self-pay

## 2022-07-23 VITALS — BP 140/88 | HR 82 | Wt 180.3 lb

## 2022-07-23 DIAGNOSIS — Z3042 Encounter for surveillance of injectable contraceptive: Secondary | ICD-10-CM

## 2022-07-23 MED ORDER — MEDROXYPROGESTERONE ACETATE 150 MG/ML IM SUSP
150.0000 mg | Freq: Once | INTRAMUSCULAR | Status: AC
  Administered 2022-07-23: 150 mg via INTRAMUSCULAR

## 2022-07-23 NOTE — Progress Notes (Signed)
Netty Starring here for Depo-Provera Injection. Injection administered without complication. Patient will return in 3 months for next injection between October 12 and October 26. Next annual visit due with next Depo.   Cline Crock, RN 07/23/2022

## 2022-10-08 ENCOUNTER — Other Ambulatory Visit: Payer: Self-pay

## 2022-10-08 ENCOUNTER — Ambulatory Visit (INDEPENDENT_AMBULATORY_CARE_PROVIDER_SITE_OTHER): Payer: Medicaid Other | Admitting: Obstetrics and Gynecology

## 2022-10-08 ENCOUNTER — Other Ambulatory Visit (HOSPITAL_COMMUNITY)
Admission: RE | Admit: 2022-10-08 | Discharge: 2022-10-08 | Disposition: A | Discharge status: Home / Self Care | Payer: Medicaid Other | Source: Ambulatory Visit | Attending: Obstetrics and Gynecology | Admitting: Obstetrics and Gynecology

## 2022-10-08 ENCOUNTER — Encounter: Payer: Self-pay | Admitting: Obstetrics and Gynecology

## 2022-10-08 DIAGNOSIS — Z3042 Encounter for surveillance of injectable contraceptive: Secondary | ICD-10-CM

## 2022-10-08 DIAGNOSIS — Z0000 Encounter for general adult medical examination without abnormal findings: Secondary | ICD-10-CM | POA: Diagnosis not present

## 2022-10-08 DIAGNOSIS — Z01419 Encounter for gynecological examination (general) (routine) without abnormal findings: Secondary | ICD-10-CM | POA: Diagnosis present

## 2022-10-08 MED ORDER — MEDROXYPROGESTERONE ACETATE 150 MG/ML IM SUSP: 150.0000 mg | INTRAMUSCULAR | 3 refills | Status: AC

## 2022-10-08 NOTE — Progress Notes (Signed)
Lisa Zimmerman is a 31 y.o. G67P1011 female here for a routine annual gynecologic exam.  Current complaints: No.   Denies abnormal vaginal bleeding, discharge, pelvic pain, problems with intercourse or other gynecologic concerns.    Gynecologic History No LMP recorded. Patient has had an injection. Contraception: Depo-Provera injections Last Pap: 7/20. Results were: normal Last mammogram: NA  Obstetric History OB History  Gravida Para Term Preterm AB Living  2 1 1  0 1 1  SAB IAB Ectopic Multiple Live Births  0 0 0   1    # Outcome Date GA Lbr Len/2nd Weight Sex Delivery Anes PTL Lv  2 Term 10/29/19 [redacted]w[redacted]d 13:47 / 03:32 6 lb 7.2 oz (2.925 kg) M Vag-Spont EPI  LIV  1 AB             Past Medical History:  Diagnosis Date   Anemia    Eczema of both hands     Past Surgical History:  Procedure Laterality Date   sinus polyp removal     WISDOM TOOTH EXTRACTION  04/2018    Current Outpatient Medications on File Prior to Visit  Medication Sig Dispense Refill   diphenhydrAMINE (BENADRYL) 25 MG tablet Take 25 mg by mouth every 6 (six) hours as needed.     loratadine (CLARITIN) 10 MG tablet Take 10 mg by mouth daily.     Current Facility-Administered Medications on File Prior to Visit  Medication Dose Route Frequency Provider Last Rate Last Admin   medroxyPROGESTERone (DEPO-PROVERA) injection 150 mg  150 mg Intramuscular Q90 days Laury Deep, CNM   150 mg at 08/28/21 1533    No Known Allergies  Social History   Socioeconomic History   Marital status: Single    Spouse name: Not on file   Number of children: Not on file   Years of education: High School   Highest education level: Associate degree: occupational, Hotel manager, or vocational program  Occupational History   Not on file  Tobacco Use   Smoking status: Never    Passive exposure: Yes   Smokeless tobacco: Never  Vaping Use   Vaping Use: Never used  Substance and Sexual Activity   Alcohol use: Not Currently   Drug  use: Not Currently    Types: Marijuana   Sexual activity: Yes    Birth control/protection: Injection  Other Topics Concern   Not on file  Social History Narrative   ** Merged History Encounter **       Social Determinants of Health   Financial Resource Strain: Not on file  Food Insecurity: No Food Insecurity (07/23/2022)   Hunger Vital Sign    Worried About Running Out of Food in the Last Year: Never true    Ran Out of Food in the Last Year: Never true  Transportation Needs: No Transportation Needs (07/23/2022)   PRAPARE - Hydrologist (Medical): No    Lack of Transportation (Non-Medical): No  Physical Activity: Inactive (09/06/2019)   Exercise Vital Sign    Days of Exercise per Week: 0 days    Minutes of Exercise per Session: 0 min  Stress: Not on file  Social Connections: Unknown (09/06/2019)   Social Connection and Isolation Panel [NHANES]    Frequency of Communication with Friends and Family: Not on file    Frequency of Social Gatherings with Friends and Family: Not on file    Attends Religious Services: Not on file    Active Member of Clubs or Organizations:  Not on file    Attends Club or Organization Meetings: Not on file    Marital Status: Never married  Intimate Partner Violence: Not At Risk (09/06/2019)   Humiliation, Afraid, Rape, and Kick questionnaire    Fear of Current or Ex-Partner: No    Emotionally Abused: No    Physically Abused: No    Sexually Abused: No    Family History  Problem Relation Age of Onset   Hypertension Father    Heart disease Father    Seizures Brother     The following portions of the patient's history were reviewed and updated as appropriate: allergies, current medications, past family history, past medical history, past social history, past surgical history and problem list.  Review of Systems Pertinent items noted in HPI and remainder of comprehensive ROS otherwise negative.   Objective:  BP 116/85    Pulse 89   Ht 5\' 2"  (1.575 m)   Wt 180 lb 1.6 oz (81.7 kg)   BMI 32.94 kg/m  CONSTITUTIONAL: Well-developed, well-nourished female in no acute distress.  HENT:  Normocephalic, atraumatic, External right and left ear normal. Oropharynx is clear and moist EYES: Conjunctivae and EOM are normal. Pupils are equal, round, and reactive to light. No scleral icterus.  NECK: Normal range of motion, supple, no masses.  Normal thyroid.  SKIN: Skin is warm and dry. No rash noted. Not diaphoretic. No erythema. No pallor. NEUROLGIC: Alert and oriented to person, place, and time. Normal reflexes, muscle tone coordination. No cranial nerve deficit noted. PSYCHIATRIC: Normal mood and affect. Normal behavior. Normal judgment and thought content. CARDIOVASCULAR: Normal heart rate noted, regular rhythm RESPIRATORY: Clear to auscultation bilaterally. Effort and breath sounds normal, no problems with respiration noted. BREASTS: Symmetric in size. No masses, skin changes, nipple drainage, or lymphadenopathy. ABDOMEN: Soft, normal bowel sounds, no distention noted.  No tenderness, rebound or guarding.  PELVIC: Normal appearing external genitalia; normal appearing vaginal mucosa and cervix.  No abnormal discharge noted.  Pap smear obtained.  Normal uterine size, no other palpable masses, no uterine or adnexal tenderness. MUSCULOSKELETAL: Normal range of motion. No tenderness.  No cyanosis, clubbing, or edema.  2+ distal pulses.   Assessment:  Annual gynecologic examination with pap smear Depo Provera contraception Plan:  Will follow up results of pap smear and manage accordingly. Declined STD testing and Flu vaccine. Continue with Depo Provera for contraception Routine preventative health maintenance measures emphasized. Please refer to After Visit Summary for other counseling recommendations.    , MD, FACOG Attending Obstetrician & Gynecologist Center for [facility], [facility]

## 2022-10-08 NOTE — Patient Instructions (Signed)

## 2022-10-08 NOTE — Addendum Note (Signed)
Addended by: Donell Beers T on: 10/08/2022 03:41 PM   Modules accepted: Orders

## 2022-10-12 ENCOUNTER — Ambulatory Visit: Payer: Medicaid Other | Admitting: Obstetrics & Gynecology

## 2022-10-13 LAB — CYTOLOGY - PAP
Comment: NEGATIVE
Diagnosis: NEGATIVE
High risk HPV: NEGATIVE

## 2023-01-05 ENCOUNTER — Ambulatory Visit (INDEPENDENT_AMBULATORY_CARE_PROVIDER_SITE_OTHER): Payer: Medicaid Other

## 2023-01-05 VITALS — BP 106/86 | HR 103 | Wt 182.4 lb

## 2023-01-05 DIAGNOSIS — Z3042 Encounter for surveillance of injectable contraceptive: Secondary | ICD-10-CM

## 2023-01-05 MED ORDER — MEDROXYPROGESTERONE ACETATE 150 MG/ML IM SUSP
150.0000 mg | Freq: Once | INTRAMUSCULAR | Status: AC
  Administered 2023-01-05: 150 mg via INTRAMUSCULAR

## 2023-01-05 NOTE — Progress Notes (Signed)
Lisa Zimmerman here for Depo-Provera Injection. Injection administered without complication. Patient will return in 3 months for next injection between 03/24/23 and 04/07/23. Next annual visit due October 2024.   Annabell Howells, RN 01/05/2023  9:52 AM

## 2023-03-24 ENCOUNTER — Ambulatory Visit: Payer: Medicaid Other

## 2023-03-25 ENCOUNTER — Ambulatory Visit (INDEPENDENT_AMBULATORY_CARE_PROVIDER_SITE_OTHER): Payer: Medicaid Other | Admitting: *Deleted

## 2023-03-25 ENCOUNTER — Other Ambulatory Visit: Payer: Self-pay

## 2023-03-25 VITALS — BP 141/74 | HR 101 | Ht 65.5 in | Wt 187.5 lb

## 2023-03-25 DIAGNOSIS — Z3042 Encounter for surveillance of injectable contraceptive: Secondary | ICD-10-CM

## 2023-03-25 MED ORDER — MEDROXYPROGESTERONE ACETATE 150 MG/ML IM SUSP
150.0000 mg | Freq: Once | INTRAMUSCULAR | Status: AC
  Administered 2023-03-25: 150 mg via INTRAMUSCULAR

## 2023-03-25 NOTE — Progress Notes (Signed)
Here for depo-provera. Last injection 01/05/23. Last exam was 10/08/22. Last pap was 10/08/22. BP elevated at 145/102.She is not on BP meds.  She states she has seen her PCP recently for illness and BP was a little elevated. BP repeated  and was improved at 141 74.  We discussed she should follow up with PCP re: her blood pressure. Injection given without complaint. Sent to registar to schedule next injection.  Staci Acosta

## 2023-03-26 ENCOUNTER — Encounter: Payer: Self-pay | Admitting: Obstetrics and Gynecology

## 2023-03-29 ENCOUNTER — Encounter: Payer: Self-pay | Admitting: Obstetrics and Gynecology

## 2023-06-10 ENCOUNTER — Other Ambulatory Visit: Payer: Self-pay

## 2023-06-10 ENCOUNTER — Ambulatory Visit (INDEPENDENT_AMBULATORY_CARE_PROVIDER_SITE_OTHER): Payer: Medicaid Other | Admitting: *Deleted

## 2023-06-10 VITALS — BP 130/96 | HR 101 | Ht 63.0 in | Wt 181.8 lb

## 2023-06-10 DIAGNOSIS — Z3042 Encounter for surveillance of injectable contraceptive: Secondary | ICD-10-CM | POA: Diagnosis not present

## 2023-06-10 MED ORDER — MEDROXYPROGESTERONE ACETATE 150 MG/ML IM SUSY
150.0000 mg | PREFILLED_SYRINGE | Freq: Once | INTRAMUSCULAR | Status: AC
  Administered 2023-06-10: 150 mg via INTRAMUSCULAR

## 2023-06-10 NOTE — Progress Notes (Signed)
Lisa Zimmerman here for Depo-Provera  Injection.  Injection administered without complication. Patient will return in 3 months for next injection. BP elevated. Patient has not made appt wiith PCP as discussed at last visit. Assisted today to make appointment with new PCP in MyChart. Discussed complications and warning signs HTN. She voices understanding. [name] [facility] 06/10/2023  9:12 AM

## 2023-08-04 ENCOUNTER — Ambulatory Visit: Payer: Medicaid Other | Admitting: Family Medicine

## 2023-08-12 ENCOUNTER — Ambulatory Visit: Payer: Medicaid Other | Admitting: Family Medicine

## 2023-08-26 ENCOUNTER — Ambulatory Visit (INDEPENDENT_AMBULATORY_CARE_PROVIDER_SITE_OTHER): Payer: Medicaid Other

## 2023-08-26 ENCOUNTER — Other Ambulatory Visit: Payer: Self-pay

## 2023-08-26 VITALS — BP 143/78 | HR 94 | Ht 65.0 in | Wt 185.5 lb

## 2023-08-26 DIAGNOSIS — Z3042 Encounter for surveillance of injectable contraceptive: Secondary | ICD-10-CM

## 2023-08-26 MED ORDER — MEDROXYPROGESTERONE ACETATE 150 MG/ML IM SUSY
150.0000 mg | PREFILLED_SYRINGE | Freq: Once | INTRAMUSCULAR | Status: AC
  Administered 2023-08-26: 150 mg via INTRAMUSCULAR

## 2023-08-26 NOTE — Progress Notes (Signed)
Lisa Zimmerman here for Depo-Provera Injection. Injection administered without complication. Patient will return in 3 months for next injection between November 19 and November 28. Next annual visit due at next visit depo.   Ralene Bathe, RN 08/26/2023  9:31 AM

## 2023-09-23 ENCOUNTER — Ambulatory Visit: Payer: Medicaid Other | Admitting: Obstetrics and Gynecology

## 2023-10-21 ENCOUNTER — Encounter: Payer: Self-pay | Admitting: Obstetrics and Gynecology

## 2023-10-21 ENCOUNTER — Ambulatory Visit: Payer: Medicaid Other | Admitting: Obstetrics and Gynecology

## 2023-10-21 ENCOUNTER — Other Ambulatory Visit: Payer: Self-pay

## 2023-10-21 VITALS — BP 118/84 | HR 111 | Ht 65.0 in | Wt 186.0 lb

## 2023-10-21 DIAGNOSIS — Z01419 Encounter for gynecological examination (general) (routine) without abnormal findings: Secondary | ICD-10-CM

## 2023-10-21 NOTE — Progress Notes (Signed)
ANNUAL EXAM Patient name: Lisa Zimmerman MRN [id number]  Date of birth: 21-Jun-1991 Chief Complaint:   Gynecologic Exam  History of Present Illness:   Lisa Zimmerman is a 32 y.o. G2P1011 being seen today for a routine annual exam.  Current complaints: none  Menstrual concerns? No  may have some spotting/light bleeding but overall no menses with depo Breast or nipple changes? No  Contraception use? Yes depo Sexually active? Yes no pain with intercourse   FH negative for breast, ovarian, and uterine cancer  No LMP recorded. Patient has had an injection.   Upstream - 10/21/23 1007       Pregnancy Intention Screening   Does the patient want to become pregnant in the next year? No    Does the patient's partner want to become pregnant in the next year? No    Would the patient like to discuss contraceptive options today? No      Contraception Wrap Up   Current Method Hormonal Injection    End Method Hormonal Injection    Contraception Counseling Provided Yes            The pregnancy intention screening data noted above was reviewed. Potential methods of contraception were discussed. The patient elected to proceed with Hormonal Injection.   Last pap     Component Value Date/Time   DIAGPAP  10/08/2022 1541    - Negative for intraepithelial lesion or malignancy (NILM)   HPVHIGH Negative 10/08/2022 1541   ADEQPAP  10/08/2022 1541    Satisfactory for evaluation; transformation zone component PRESENT.     H/O abnormal pap: no Last mammogram: n/a.  Last colonoscopy: n/a.      10/21/2023   10:07 AM 10/08/2022    4:11 PM 07/23/2022    1:54 PM 09/14/2019   10:57 AM  Depression screen PHQ 2/9  Decreased Interest 1 1 1 1   Down, Depressed, Hopeless 2 2 1 1   PHQ - 2 Score 3 3 2 2   Altered sleeping 1 0 1 2  Tired, decreased energy 1 1 1 1   Change in appetite 0 0 0 0  Feeling bad or failure about yourself  0 1 1 1   Trouble concentrating 0 0 0 0  Moving slowly or  fidgety/restless 0 0 0 0  Suicidal thoughts 0 0 0 0  PHQ-9 Score 5 5 5 6   Difficult doing work/chores    Not difficult at all        10/21/2023   10:07 AM 10/08/2022    4:11 PM 07/23/2022    1:54 PM 09/14/2019   10:58 AM  GAD 7 : Generalized Anxiety Score  Nervous, Anxious, on Edge 1 2 2 2   Control/stop worrying 2 2 2 1   Worry too much - different things 1 1 2 1   Trouble relaxing 1 1 0 0  Restless 0 0 0 0  Easily annoyed or irritable 0 0 1 1  Afraid - awful might happen 1 2 1  0  Total GAD 7 Score 6 8 8 5   Anxiety Difficulty    Not difficult at all     Review of Systems:   Pertinent items are noted in HPI Denies any headaches, blurred vision, fatigue, shortness of breath, chest pain, abdominal pain, abnormal vaginal discharge/itching/odor/irritation, problems with periods, bowel movements, urination, or intercourse unless otherwise stated above. Pertinent History Reviewed:  Reviewed past medical,surgical, social and family history.  Reviewed problem list, medications and allergies. Physical Assessment:   Vitals:  10/21/23 1005  Weight: 186 lb (84.4 kg)  Height: 5\' 8"  (1.727 m)  Body mass index is 28.28 kg/m.        Physical Examination:   General appearance - well appearing, and in no distress  Mental status - alert, oriented to person, place, and time  Psych:  She has a normal mood and affect  Skin - warm and dry, normal color, no suspicious lesions noted  Chest - effort normal, all lung fields clear to auscultation bilaterally  Heart - normal rate and regular rhythm  Abdomen - soft, nontender, nondistended, no masses or organomegaly  Pelvic - deferred  Extremities:  No swelling or varicosities noted  Chaperone present for exam  No results found for this or any previous visit (from the past 24 hour(s)).    Assessment & Plan:  1. Well woman exam with routine gynecological exam - Cervical cancer screening: Discussed guidelines. Pap with HPV due 2026 - GC/CT:  declines - Birth Control: Depo - Breast Health: Encouraged self breast awareness/SBE. Teaching provided.  - F/U 12 months and prn - next depo scheduled   No orders of the defined types were placed in this encounter.   Meds: No orders of the defined types were placed in this encounter.   Follow-up: No follow-ups on file.  Lisa Shire, MD 10/21/2023 10:08 AM

## 2023-11-16 ENCOUNTER — Other Ambulatory Visit: Payer: Self-pay

## 2023-11-16 ENCOUNTER — Ambulatory Visit (INDEPENDENT_AMBULATORY_CARE_PROVIDER_SITE_OTHER): Payer: Medicaid Other

## 2023-11-16 VITALS — BP 140/86 | HR 87 | Ht 65.0 in | Wt 188.2 lb

## 2023-11-16 DIAGNOSIS — Z3042 Encounter for surveillance of injectable contraceptive: Secondary | ICD-10-CM

## 2023-11-16 MED ORDER — MEDROXYPROGESTERONE ACETATE 150 MG/ML IM SUSY
150.0000 mg | PREFILLED_SYRINGE | Freq: Once | INTRAMUSCULAR | Status: AC
  Administered 2023-11-16: 150 mg via INTRAMUSCULAR

## 2023-11-16 NOTE — Progress Notes (Signed)
Lisa Zimmerman here for Depo-Provera Injection. Injection administered without complication. Patient will return in 3 months for next injection between Feb 4 and Feb 18. Next annual visit due 10/2024.   Pt scheduled for next Depo Provera on 02/03/2024.   Pt presents with elevated BP.  Pt states that she had not taken her BP medication this am but will as soon as she gets home.  Pt denies headache and visual disturbances.  Pt verbalized understanding.    Ralene Bathe, RN 11/16/2023  9:24 AM

## 2024-02-03 ENCOUNTER — Ambulatory Visit: Payer: Medicaid Other | Admitting: *Deleted

## 2024-02-03 ENCOUNTER — Other Ambulatory Visit: Payer: Self-pay

## 2024-02-03 VITALS — BP 132/100 | HR 108 | Ht 65.0 in | Wt 190.7 lb

## 2024-02-03 DIAGNOSIS — Z3042 Encounter for surveillance of injectable contraceptive: Secondary | ICD-10-CM | POA: Diagnosis not present

## 2024-02-03 MED ORDER — MEDROXYPROGESTERONE ACETATE 150 MG/ML IM SUSP
150.0000 mg | Freq: Once | INTRAMUSCULAR | Status: AC
  Administered 2024-02-03: 150 mg via INTRAMUSCULAR

## 2024-02-03 NOTE — Progress Notes (Signed)
 Depo Provera  150 mg IM administered as scheduled. Pt tolerated well. Next injection due 4/24-5/8. Next Annual Exam due after 10/20/24. Pt has elevated BP today - 132/100, repeat 143/94.  She states she has taken her medication earlier today. She denies H/A or visual disturbances.  Pt was advised to contact her PCP regarding elevated BP.  She voiced understanding and agreed.

## 2024-04-20 ENCOUNTER — Ambulatory Visit: Payer: Medicaid Other | Admitting: General Practice

## 2024-04-20 ENCOUNTER — Other Ambulatory Visit: Payer: Self-pay

## 2024-04-20 VITALS — BP 123/86 | HR 120 | Ht 65.0 in | Wt 184.0 lb

## 2024-04-20 DIAGNOSIS — Z3042 Encounter for surveillance of injectable contraceptive: Secondary | ICD-10-CM

## 2024-04-20 MED ORDER — MEDROXYPROGESTERONE ACETATE 150 MG/ML IM SUSY
150.0000 mg | PREFILLED_SYRINGE | Freq: Once | INTRAMUSCULAR | Status: AC
  Administered 2024-04-20: 150 mg via INTRAMUSCULAR

## 2024-04-20 NOTE — Progress Notes (Signed)
 Courtland Ditch here for Depo-Provera  Injection. Injection administered without complication. Patient will return in 3 months for next injection between 7/10 and 7/24. Next annual visit due October 2025.   Doria Garden, RN 04/20/2024  10:39 AM

## 2024-07-06 ENCOUNTER — Other Ambulatory Visit: Payer: Self-pay

## 2024-07-06 ENCOUNTER — Ambulatory Visit: Admitting: General Practice

## 2024-07-06 ENCOUNTER — Encounter: Payer: Self-pay | Admitting: General Practice

## 2024-07-06 VITALS — BP 123/88 | HR 109 | Ht 65.0 in | Wt 189.0 lb

## 2024-07-06 DIAGNOSIS — Z3042 Encounter for surveillance of injectable contraceptive: Secondary | ICD-10-CM | POA: Diagnosis not present

## 2024-07-06 MED ORDER — MEDROXYPROGESTERONE ACETATE 150 MG/ML IM SUSY
150.0000 mg | PREFILLED_SYRINGE | Freq: Once | INTRAMUSCULAR | Status: AC
  Administered 2024-07-06: 150 mg via INTRAMUSCULAR

## 2024-07-06 NOTE — Progress Notes (Signed)
 Lisa Zimmerman here for Depo-Provera  Injection. Injection administered without complication. Patient will return in 3 months for next injection between 9/25 and 10/9. Next annual visit due after October 24,2025.   Elenor Mole, RN 07/06/2024  9:28 AM

## 2024-08-30 ENCOUNTER — Encounter: Payer: Self-pay | Admitting: Family Medicine

## 2024-08-30 ENCOUNTER — Ambulatory Visit (INDEPENDENT_AMBULATORY_CARE_PROVIDER_SITE_OTHER): Admitting: Family Medicine

## 2024-08-30 VITALS — BP 139/88 | HR 114 | Temp 98.0°F | Ht 65.0 in | Wt 191.6 lb

## 2024-08-30 DIAGNOSIS — Z0001 Encounter for general adult medical examination with abnormal findings: Secondary | ICD-10-CM | POA: Diagnosis not present

## 2024-08-30 DIAGNOSIS — I10 Essential (primary) hypertension: Secondary | ICD-10-CM

## 2024-08-30 DIAGNOSIS — Z0000 Encounter for general adult medical examination without abnormal findings: Secondary | ICD-10-CM | POA: Insufficient documentation

## 2024-08-30 DIAGNOSIS — F411 Generalized anxiety disorder: Secondary | ICD-10-CM

## 2024-08-30 DIAGNOSIS — Z13220 Encounter for screening for lipoid disorders: Secondary | ICD-10-CM

## 2024-08-30 DIAGNOSIS — R7303 Prediabetes: Secondary | ICD-10-CM

## 2024-08-30 DIAGNOSIS — J302 Other seasonal allergic rhinitis: Secondary | ICD-10-CM | POA: Diagnosis not present

## 2024-08-30 DIAGNOSIS — L309 Dermatitis, unspecified: Secondary | ICD-10-CM

## 2024-08-30 MED ORDER — FLUOXETINE HCL 10 MG PO CAPS: 10.0000 mg | ORAL_CAPSULE | Freq: Every day | ORAL | 3 refills | Status: DC

## 2024-08-30 NOTE — Progress Notes (Signed)
 New Patient Office Visit  Subjective    Patient ID: Lisa Zimmerman, female    DOB: 03-25-1991  Age: 33 y.o. MRN: [id number]  CC:  Chief Complaint  Patient presents with   Establish Care    Concerns about Mental health and Blood Pressure     HPI Lisa Zimmerman presents to establish care. Oriented to practice routines and expectations. Has been seeing PCP as recently as 12/2023. Has upcoming cataract surgery in her right eye and has concerns about her BP. Checks daily but does not recall readings however she did get a high reading at her pre-op appt. Does admit to forgetting her medications several days a week. Denies chest pain, palpitations, recurrent headaches, vision changes, lightheadedness, dizziness, dyspnea on exertion, or swelling of extremities.  Other PMH includes prediabetes (last A1c 6.2%), HTN, eczema and allergies.   She does see OBGYN. [id number], on Depo, PAP 2023  Other concerns include anxiety and depression since she was a teenager. She reports today her anxiety is worse last few years. Does not see a therapist, has not taken medication in past. Endorses excessive worrying and social anxiety. Denies hallucinations, delusions, insomnia.        08/30/2024   11:39 AM 10/21/2023   10:07 AM 10/08/2022    4:11 PM 07/23/2022    1:54 PM  GAD 7 : Generalized Anxiety Score  Nervous, Anxious, on Edge 3 1 2 2   Control/stop worrying 2 2 2 2   Worry too much - different things 2 1 1 2   Trouble relaxing 1 1 1  0  Restless 0 0 0 0  Easily annoyed or irritable 0 0 0 1  Afraid - awful might happen 2 1 2 1   Total GAD 7 Score 10 6 8 8   Anxiety Difficulty Very difficult          08/30/2024   11:38 AM 10/21/2023   10:07 AM 10/08/2022    4:11 PM 07/23/2022    1:54 PM 09/14/2019   10:57 AM  Depression screen PHQ 2/9  Decreased Interest 2 1 1 1 1   Down, Depressed, Hopeless 2 2 2 1 1   PHQ - 2 Score 4 3 3 2 2   Altered sleeping 1 1 0 1 2  Tired, decreased energy 0 1 1 1 1   Change in  appetite 0 0 0 0 0  Feeling bad or failure about yourself  2 0 1 1 1   Trouble concentrating 1 0 0 0 0  Moving slowly or fidgety/restless 0 0 0 0 0  Suicidal thoughts 0 0 0 0 0  PHQ-9 Score 8 5 5 5 6   Difficult doing work/chores Somewhat difficult    Not difficult at all      Outpatient Encounter Medications as of 08/30/2024  Medication Sig   FLUoxetine  (PROZAC ) 10 MG capsule Take 1 capsule (10 mg total) by mouth daily.   lisinopril-hydrochlorothiazide (ZESTORETIC) 20-25 MG tablet 1 tablet Orally Once a day for 90 days   loratadine (CLARITIN) 10 MG tablet Take 10 mg by mouth daily.   medroxyPROGESTERone  (DEPO-PROVERA ) 150 MG/ML injection Inject 1 mL (150 mg total) into the muscle every 3 (three) months.   Multiple Vitamin (MULTIVITAMIN) tablet Take 1 tablet by mouth daily.   triamcinolone cream (KENALOG) 0.1 % Apply 1 Application topically 2 (two) times daily.   No facility-administered encounter medications on file as of 08/30/2024.    Past Medical History:  Diagnosis Date   Anemia    Eczema of both  hands     Past Surgical History:  Procedure Laterality Date   CHOLECYSTECTOMY  02/02/2020   sinus polyp removal     WISDOM TOOTH EXTRACTION  04/2018    Family History  Problem Relation Age of Onset   Depression Mother    Anxiety disorder Mother    Hypertension Father    Heart disease Father    Seizures Brother    Brain cancer Brother     Social History   Socioeconomic History   Marital status: Media planner    Spouse name: Franky Pepper   Number of children: 1   Years of education: High School   Highest education level: Associate degree: occupational, Scientist, product/process development, or vocational program  Occupational History   Not on file  Tobacco Use   Smoking status: Never    Passive exposure: Yes   Smokeless tobacco: Never  Vaping Use   Vaping status: Some Days   Substances: THC, CBD  Substance and Sexual Activity   Alcohol use: Yes    Alcohol/week: 2.0 - 3.0 standard drinks  of alcohol    Types: 2 - 3 Shots of liquor per week    Comment: weekly - weekends   Drug use: Not Currently    Types: Marijuana   Sexual activity: Yes    Birth control/protection: Injection  Other Topics Concern   Not on file  Social History Narrative   ** Merged History Encounter **       Social Drivers of Health   Financial Resource Strain: Not on file  Food Insecurity: No Food Insecurity (10/21/2023)   Hunger Vital Sign    Worried About Running Out of Food in the Last Year: Never true    Ran Out of Food in the Last Year: Never true  Transportation Needs: No Transportation Needs (10/21/2023)   PRAPARE - Administrator, Civil Service (Medical): No    Lack of Transportation (Non-Medical): No  Physical Activity: Inactive (09/06/2019)   Exercise Vital Sign    Days of Exercise per Week: 0 days    Minutes of Exercise per Session: 0 min  Stress: Not on file  Social Connections: Unknown (09/06/2019)   Social Connection and Isolation Panel    Frequency of Communication with Friends and Family: Not on file    Frequency of Social Gatherings with Friends and Family: Not on file    Attends Religious Services: Not on file    Active Member of Clubs or Organizations: Not on file    Attends Banker Meetings: Not on file    Marital Status: Never married  Intimate Partner Violence: Not At Risk (09/06/2019)   Humiliation, Afraid, Rape, and Kick questionnaire    Fear of Current or Ex-Partner: No    Emotionally Abused: No    Physically Abused: No    Sexually Abused: No    Review of Systems  All other systems reviewed and are negative.       Objective    BP 139/88   Pulse (!) 114   Temp 98 F (36.7 C)   Ht 5' 5 (1.651 m)   Wt 191 lb 9.6 oz (86.9 kg)   SpO2 98%   BMI 31.88 kg/m   Physical Exam Vitals and nursing note reviewed.  Constitutional:      Appearance: Normal appearance. She is obese.  HENT:     Head: Normocephalic and atraumatic.     Right  Ear: Tympanic membrane, ear canal and external ear normal.  Left Ear: Tympanic membrane, ear canal and external ear normal.     Nose: Nose normal.     Mouth/Throat:     Mouth: Mucous membranes are moist.     Pharynx: Oropharynx is clear.  Eyes:     Extraocular Movements: Extraocular movements intact.     Conjunctiva/sclera: Conjunctivae normal.     Pupils: Pupils are equal, round, and reactive to light.  Cardiovascular:     Rate and Rhythm: Normal rate and regular rhythm.     Pulses: Normal pulses.     Heart sounds: Normal heart sounds.  Pulmonary:     Effort: Pulmonary effort is normal.     Breath sounds: Normal breath sounds.  Abdominal:     General: Bowel sounds are normal.     Palpations: Abdomen is soft.  Musculoskeletal:        General: Normal range of motion.     Cervical back: Normal range of motion and neck supple.  Skin:    General: Skin is warm and dry.     Capillary Refill: Capillary refill takes less than 2 seconds.  Neurological:     General: No focal deficit present.     Mental Status: She is alert and oriented to person, place, and time. Mental status is at baseline.  Psychiatric:        Mood and Affect: Mood is anxious.        Behavior: Behavior normal.        Thought Content: Thought content normal.        Judgment: Judgment normal.         Assessment & Plan:   Problem List Items Addressed This Visit     Eczema   Chronic well controlled      Seasonal allergies   Well controlled on Claritin      Physical exam, annual - Primary   Today your medical history was reviewed and routine physical exam with labs was performed. Recommend 150 minutes of moderate intensity exercise weekly and consuming a well-balanced diet. Advised to stop smoking if a smoker, avoid smoking if a non-smoker, limit alcohol consumption to 1 drink per day for women and 2 drinks per day for men, and avoid illicit drug use. Counseled in mental health awareness and when to seek  medical care. Vaccine maintenance discussed. Appropriate health maintenance items reviewed. Return to office in 1 year for annual physical exam. Declines labs today, will have them drawn week prior to next OV      Relevant Orders   CBC with Differential/Platelet   Comprehensive metabolic panel with GFR   Lipid panel   TSH   Hemoglobin A1c   Ambulatory referral to Psychiatry   Vitamin D, 25-OH,Total,IA(Refl)   Generalized anxiety disorder   Uncontrolled. GAD 10. Will refer to psychology and start Prozac  10mg  daily. Return to office in 4-6 weeks or sooner if needed. Denies SI/HI.      Relevant Medications   FLUoxetine  (PROZAC ) 10 MG capsule   Other Relevant Orders   TSH   Ambulatory referral to Psychiatry   Vitamin D, 25-OH,Total,IA(Refl)   Prediabetes   Needs A1c. Refused today. Recommend heart healthy diet such as Mediterranean diet with whole grains, fruits, vegetable, fish, lean meats, nuts, and olive oil. Limit salt. Encouraged moderate walking, 3-5 times/week for 30-50 minutes each session. Aim for at least 150 minutes.week. Goal should be pace of 3 miles/hours, or walking 1.5 miles in 30 minutes. Seek medical care for urinary frequency, extreme thirst,  vision changes, lightheadedness, dizziness.        Hypertension   At goal <140/90 today in office. Continue Lisinopril-hydrochlorothiazide 20-25mg  daily. Recommend heart healthy diet such as Mediterranean diet with whole grains, fruits, vegetable, fish, lean meats, nuts, and olive oil. Limit salt. Encouraged moderate walking, 3-5 times/week for 30-50 minutes each session. Aim for at least 150 minutes.week. Goal should be pace of 3 miles/hours, or walking 1.5 miles in 30 minutes. Avoid tobacco products. Avoid excess alcohol. Take medications as prescribed and bring medications and blood pressure log with cuff to each office visit. Seek medical care for chest pain, palpitations, shortness of breath with exertion,  dizziness/lightheadedness, vision changes, recurrent headaches, or swelling of extremities. Follow up in 1 month with labs week prior.       Other Visit Diagnoses       Screening for lipoid disorders           Return in about 6 weeks (around 10/11/2024) for anxiety/depression, chronic follow-up with labs 1 week prior.   Jeoffrey GORMAN Barrio, FNP

## 2024-08-30 NOTE — Assessment & Plan Note (Signed)
 At goal <140/90 today in office. Continue Lisinopril-hydrochlorothiazide 20-25mg  daily. Recommend heart healthy diet such as Mediterranean diet with whole grains, fruits, vegetable, fish, lean meats, nuts, and olive oil. Limit salt. Encouraged moderate walking, 3-5 times/week for 30-50 minutes each session. Aim for at least 150 minutes.week. Goal should be pace of 3 miles/hours, or walking 1.5 miles in 30 minutes. Avoid tobacco products. Avoid excess alcohol. Take medications as prescribed and bring medications and blood pressure log with cuff to each office visit. Seek medical care for chest pain, palpitations, shortness of breath with exertion, dizziness/lightheadedness, vision changes, recurrent headaches, or swelling of extremities. Follow up in 1 month with labs week prior.

## 2024-08-30 NOTE — Patient Instructions (Signed)
 It was great to meet you today and I'm excited to have you join the [facility] practice. I hope you had a positive experience today! If you feel so inclined, please feel free to recommend our practice to friends and family. Jeoffrey Barrio, FNP-C

## 2024-08-30 NOTE — Assessment & Plan Note (Signed)
 Chronic well-controlled

## 2024-08-30 NOTE — Assessment & Plan Note (Addendum)
 Uncontrolled. GAD 10. Will refer to psychology and start Prozac  10mg  daily. Return to office in 4-6 weeks or sooner if needed. Denies SI/HI.

## 2024-08-30 NOTE — Assessment & Plan Note (Signed)
Well controlled on Claritin.

## 2024-08-30 NOTE — Assessment & Plan Note (Signed)
 Needs A1c. Refused today. Recommend heart healthy diet such as Mediterranean diet with whole grains, fruits, vegetable, fish, lean meats, nuts, and olive oil. Limit salt. Encouraged moderate walking, 3-5 times/week for 30-50 minutes each session. Aim for at least 150 minutes.week. Goal should be pace of 3 miles/hours, or walking 1.5 miles in 30 minutes. Seek medical care for urinary frequency, extreme thirst, vision changes, lightheadedness, dizziness.

## 2024-08-30 NOTE — Assessment & Plan Note (Signed)
 Today your medical history was reviewed and routine physical exam with labs was performed. Recommend 150 minutes of moderate intensity exercise weekly and consuming a well-balanced diet. Advised to stop smoking if a smoker, avoid smoking if a non-smoker, limit alcohol consumption to 1 drink per day for women and 2 drinks per day for men, and avoid illicit drug use. Counseled in mental health awareness and when to seek medical care. Vaccine maintenance discussed. Appropriate health maintenance items reviewed. Return to office in 1 year for annual physical exam. Declines labs today, will have them drawn week prior to next OV

## 2024-09-21 ENCOUNTER — Ambulatory Visit

## 2024-09-21 ENCOUNTER — Other Ambulatory Visit: Payer: Self-pay

## 2024-09-21 VITALS — BP 126/77 | HR 122 | Ht 65.0 in | Wt 186.8 lb

## 2024-09-21 DIAGNOSIS — Z3042 Encounter for surveillance of injectable contraceptive: Secondary | ICD-10-CM

## 2024-09-21 MED ORDER — MEDROXYPROGESTERONE ACETATE 150 MG/ML IM SUSY
150.0000 mg | PREFILLED_SYRINGE | Freq: Once | INTRAMUSCULAR | Status: AC
  Administered 2024-09-21: 150 mg via INTRAMUSCULAR

## 2024-09-21 NOTE — Addendum Note (Signed)
 Addended by: GEOFFREY DEVON SAUNDERS on: 09/21/2024 11:54 AM   Modules accepted: Level of Service

## 2024-09-21 NOTE — Progress Notes (Signed)
 Lisa Zimmerman here for Depo-Provera  Injection. Injection administered without complication. Patient will return in 3 months for next injection between 12/07/24 and 12/21/24. Patient voices no questions or concerns. Next annual October, 2025.   Devon, RN 09/21/2024

## 2024-10-04 ENCOUNTER — Other Ambulatory Visit

## 2024-10-04 DIAGNOSIS — Z0000 Encounter for general adult medical examination without abnormal findings: Secondary | ICD-10-CM

## 2024-10-09 ENCOUNTER — Other Ambulatory Visit

## 2024-10-09 DIAGNOSIS — I10 Essential (primary) hypertension: Secondary | ICD-10-CM

## 2024-10-09 DIAGNOSIS — Z13220 Encounter for screening for lipoid disorders: Secondary | ICD-10-CM

## 2024-10-09 DIAGNOSIS — Z1321 Encounter for screening for nutritional disorder: Secondary | ICD-10-CM

## 2024-10-09 DIAGNOSIS — R7303 Prediabetes: Secondary | ICD-10-CM

## 2024-10-10 LAB — CBC WITH DIFFERENTIAL/PLATELET
Absolute Lymphocytes: 3102 {cells}/uL (ref 850–3900)
Absolute Monocytes: 721 {cells}/uL (ref 200–950)
Basophils Absolute: 57 {cells}/uL (ref 0–200)
Basophils Relative: 0.7 %
Eosinophils Absolute: 575 {cells}/uL — ABNORMAL HIGH (ref 15–500)
Eosinophils Relative: 7.1 %
HCT: 43.3 % (ref 35.0–45.0)
Hemoglobin: 14.2 g/dL (ref 11.7–15.5)
MCH: 30.5 pg (ref 27.0–33.0)
MCHC: 32.8 g/dL (ref 32.0–36.0)
MCV: 93.1 fL (ref 80.0–100.0)
MPV: 9.6 fL (ref 7.5–12.5)
Monocytes Relative: 8.9 %
Neutro Abs: 3645 {cells}/uL (ref 1500–7800)
Neutrophils Relative %: 45 %
Platelets: 337 Thousand/uL (ref 140–400)
RBC: 4.65 Million/uL (ref 3.80–5.10)
RDW: 13 % (ref 11.0–15.0)
Total Lymphocyte: 38.3 %
WBC: 8.1 Thousand/uL (ref 3.8–10.8)

## 2024-10-10 LAB — COMPREHENSIVE METABOLIC PANEL WITH GFR
AG Ratio: 1.5 (calc) (ref 1.0–2.5)
ALT: 34 U/L — ABNORMAL HIGH (ref 6–29)
AST: 25 U/L (ref 10–30)
Albumin: 4.5 g/dL (ref 3.6–5.1)
Alkaline phosphatase (APISO): 53 U/L (ref 31–125)
BUN/Creatinine Ratio: 11 (calc) (ref 6–22)
BUN: 11 mg/dL (ref 7–25)
CO2: 27 mmol/L (ref 20–32)
Calcium: 10 mg/dL (ref 8.6–10.2)
Chloride: 103 mmol/L (ref 98–110)
Creat: 1.04 mg/dL — ABNORMAL HIGH (ref 0.50–0.97)
Globulin: 3 g/dL (ref 1.9–3.7)
Glucose, Bld: 116 mg/dL — ABNORMAL HIGH (ref 65–99)
Potassium: 4.1 mmol/L (ref 3.5–5.3)
Sodium: 138 mmol/L (ref 135–146)
Total Bilirubin: 0.3 mg/dL (ref 0.2–1.2)
Total Protein: 7.5 g/dL (ref 6.1–8.1)
eGFR: 73 mL/min/1.73m2 (ref >=60)

## 2024-10-10 LAB — HEMOGLOBIN A1C
Hgb A1c MFr Bld: 6 % — ABNORMAL HIGH (ref <5.7)
Mean Plasma Glucose: 126 mg/dL
eAG (mmol/L): 7 mmol/L

## 2024-10-10 LAB — LIPID PANEL
Cholesterol: 199 mg/dL (ref <200)
HDL: 54 mg/dL (ref >=50)
LDL Cholesterol (Calc): 128 mg/dL — ABNORMAL HIGH
Non-HDL Cholesterol (Calc): 145 mg/dL — ABNORMAL HIGH (ref <130)
Total CHOL/HDL Ratio: 3.7 (calc) (ref <5.0)
Triglycerides: 76 mg/dL (ref <150)

## 2024-10-10 LAB — VITAMIN D 25 HYDROXY (VIT D DEFICIENCY, FRACTURES): Vit D, 25-Hydroxy: 34 ng/mL (ref 30–100)

## 2024-10-11 ENCOUNTER — Encounter: Payer: Self-pay | Admitting: Family Medicine

## 2024-10-11 ENCOUNTER — Ambulatory Visit (INDEPENDENT_AMBULATORY_CARE_PROVIDER_SITE_OTHER): Admitting: Family Medicine

## 2024-10-11 VITALS — BP 136/70 | HR 105 | Temp 98.3°F | Ht 65.0 in | Wt 188.2 lb

## 2024-10-11 DIAGNOSIS — R7303 Prediabetes: Secondary | ICD-10-CM | POA: Diagnosis not present

## 2024-10-11 DIAGNOSIS — I10 Essential (primary) hypertension: Secondary | ICD-10-CM | POA: Diagnosis not present

## 2024-10-11 DIAGNOSIS — F411 Generalized anxiety disorder: Secondary | ICD-10-CM | POA: Diagnosis not present

## 2024-10-11 DIAGNOSIS — E782 Mixed hyperlipidemia: Secondary | ICD-10-CM | POA: Diagnosis not present

## 2024-10-11 MED ORDER — LISINOPRIL-HYDROCHLOROTHIAZIDE 20-25 MG PO TABS: 1.0000 | ORAL_TABLET | Freq: Every day | ORAL | 1 refills | Status: AC

## 2024-10-11 NOTE — Assessment & Plan Note (Signed)
 Well controlled on Prozac  10mg  daily, mood improved, starting therapy this month

## 2024-10-11 NOTE — Assessment & Plan Note (Signed)
 At goal <140/90 today in office. Continue Lisinopril-hydrochlorothiazide 20-25mg  daily. Recommend heart healthy diet such as Mediterranean diet with whole grains, fruits, vegetable, fish, lean meats, nuts, and olive oil. Limit salt. Encouraged moderate walking, 3-5 times/week for 30-50 minutes each session. Aim for at least 150 minutes.week. Goal should be pace of 3 miles/hours, or walking 1.5 miles in 30 minutes. Avoid tobacco products. Avoid excess alcohol. Take medications as prescribed and bring medications and blood pressure log with cuff to each office visit. Seek medical care for chest pain, palpitations, shortness of breath with exertion, dizziness/lightheadedness, vision changes, recurrent headaches, or swelling of extremities. Follow up in 3 months with labs week prior.

## 2024-10-11 NOTE — Assessment & Plan Note (Signed)
Your labs showed elevated cholesterol. I recommend consuming a heart healthy diet such as Mediterranean diet or DASH diet with whole grains, fruits, vegetable, fish, lean meats, nuts, and olive oil. Limit sweets and processed foods. I also encourage moderate intensity exercise 150 minutes weekly. This is 3-5 times weekly for 30-50 minutes each session. Goal should be pace of 3 miles/hours, or walking 1.5 miles in 30 minutes. The ASCVD Risk score (Arnett DK, et al., 2019) failed to calculate for the following reasons:   The 2019 ASCVD risk score is only valid for ages 31 to 40

## 2024-10-11 NOTE — Progress Notes (Signed)
 Subjective:  HPI: Lisa Zimmerman is a 33 y.o. female presenting on 10/11/2024 for Medical Management of Chronic Issues (Anxiety and depression /Review labs )   HPI Patient is in today for follow up for anxiety. At her initial visit to establish care Lisa Zimmerman reported anxiety and depression since she was a teenager. She reports today her anxiety is worse last few years. Does not see a therapist, has not taken medication in past. Endorses excessive worrying and social anxiety. Denies hallucinations, delusions, insomnia. It was decided to refer to psychology and start Prozac  10mg  daily.   Discussed the use of AI scribe software for clinical note transcription with the patient, who gave verbal consent to proceed.  History of Present Illness Lisa Zimmerman is a 33 year old female with prediabetes and hypertension who presents for follow-up on her medication and lab results.  She feels better on Prozac  without any noticeable side effects and wishes to continue on her current dose. She has not yet started therapy but has scheduled a virtual appointment for the 22nd of this month.  She is concerned about her recent lab results, particularly regarding prediabetes. Her A1c is 6.0. She consumes sodas, particularly Dr. Nunzio, and plans to reduce her intake. Advised to be mindful of carbohydrates and sweets, especially during the upcoming holiday season.  She is currently taking lisinopril-hctz 20/25 mg once daily for blood pressure. She takes it at night due to mild dizziness but does not experience nocturia.  She has a history of allergies and eczema, which she manages with topical creams. She notes occasional rashes on her shoulder.  Her recent labs showed a slightly elevated white blood cell count. Her cholesterol was noted to be borderline.  She mentions a slightly elevated kidney function test. She is trying to increase her water intake. Additionally, one liver enzyme was elevated,  and she admits to occasional Tylenol  use.       10/11/2024    9:25 AM 08/30/2024   11:39 AM 10/21/2023   10:07 AM 10/08/2022    4:11 PM  GAD 7 : Generalized Anxiety Score  Nervous, Anxious, on Edge 1 3 1 2   Control/stop worrying 1 2 2 2   Worry too much - different things 1 2 1 1   Trouble relaxing 0 1 1 1   Restless 0 0 0 0  Easily annoyed or irritable 0 0 0 0  Afraid - awful might happen 1 2 1 2   Total GAD 7 Score 4 10 6 8   Anxiety Difficulty Somewhat difficult Very difficult         10/11/2024    9:24 AM 08/30/2024   11:38 AM 10/21/2023   10:07 AM 10/08/2022    4:11 PM 07/23/2022    1:54 PM  Depression screen PHQ 2/9  Decreased Interest 1 2 1 1 1   Down, Depressed, Hopeless 2 2 2 2 1   PHQ - 2 Score 3 4 3 3 2   Altered sleeping 1 1 1  0 1  Tired, decreased energy 1 0 1 1 1   Change in appetite 0 0 0 0 0  Feeling bad or failure about yourself  1 2 0 1 1  Trouble concentrating 0 1 0 0 0  Moving slowly or fidgety/restless 0 0 0 0 0  Suicidal thoughts 0 0 0 0 0  PHQ-9 Score 6 8 5 5 5   Difficult doing work/chores Somewhat difficult Somewhat difficult        Review of Systems  All  other systems reviewed and are negative.   Relevant past medical history reviewed and updated as indicated.   Past Medical History:  Diagnosis Date   Anemia    Eczema of both hands      Past Surgical History:  Procedure Laterality Date   CHOLECYSTECTOMY  02/02/2020   sinus polyp removal     WISDOM TOOTH EXTRACTION  04/2018    Allergies and medications reviewed and updated.   Current Outpatient Medications:    FLUoxetine  (PROZAC ) 10 MG capsule, Take 1 capsule (10 mg total) by mouth daily., Disp: 90 capsule, Rfl: 3   medroxyPROGESTERone  (DEPO-PROVERA ) 150 MG/ML injection, Inject 1 mL (150 mg total) into the muscle every 3 (three) months., Disp: 1 mL, Rfl: 3   Multiple Vitamin (MULTIVITAMIN) tablet, Take 1 tablet by mouth daily., Disp: , Rfl:    ofloxacin (OCUFLOX) 0.3 % ophthalmic  solution, INSTILL 1 DROP INTO RIGHT EYE 4 TIMES DAILY FOR 7 DAYS, Disp: , Rfl:    triamcinolone cream (KENALOG) 0.1 %, Apply 1 Application topically 2 (two) times daily., Disp: , Rfl:    lisinopril-hydrochlorothiazide (ZESTORETIC) 20-25 MG tablet, Take 1 tablet by mouth daily., Disp: 90 tablet, Rfl: 1  No Known Allergies  Objective:   BP 136/70   Pulse (!) 105   Temp 98.3 F (36.8 C)   Ht 5' 5 (1.651 m)   Wt 188 lb 3.2 oz (85.4 kg)   LMP  (Approximate)   SpO2 98%   BMI 31.32 kg/m      10/11/2024    9:19 AM 09/21/2024   11:06 AM 08/30/2024   11:20 AM  Vitals with BMI  Height 5' 5 5' 5 5' 5  Weight 188 lbs 3 oz 186 lbs 13 oz 191 lbs 10 oz  BMI 31.32 31.09 31.88  Systolic 136 126 860  Diastolic 70 77 88  Pulse 105 122 114     Physical Exam Vitals and nursing note reviewed.  Constitutional:      Appearance: Normal appearance. She is normal weight.  HENT:     Head: Normocephalic and atraumatic.  Skin:    General: Skin is warm and dry.  Neurological:     General: No focal deficit present.     Mental Status: She is alert and oriented to person, place, and time. Mental status is at baseline.  Psychiatric:        Mood and Affect: Mood normal.        Behavior: Behavior normal.        Thought Content: Thought content normal.        Judgment: Judgment normal.     Assessment & Plan:  Generalized anxiety disorder Assessment & Plan: Well controlled on Prozac  10mg  daily, mood improved, starting therapy this month   Hypertension, unspecified type Assessment & Plan: At goal <140/90 today in office. Continue Lisinopril-hydrochlorothiazide 20-25mg  daily. Recommend heart healthy diet such as Mediterranean diet with whole grains, fruits, vegetable, fish, lean meats, nuts, and olive oil. Limit salt. Encouraged moderate walking, 3-5 times/week for 30-50 minutes each session. Aim for at least 150 minutes.week. Goal should be pace of 3 miles/hours, or walking 1.5 miles in 30  minutes. Avoid tobacco products. Avoid excess alcohol. Take medications as prescribed and bring medications and blood pressure log with cuff to each office visit. Seek medical care for chest pain, palpitations, shortness of breath with exertion, dizziness/lightheadedness, vision changes, recurrent headaches, or swelling of extremities. Follow up in 3 months with labs week prior.  Orders: -     Comprehensive metabolic panel with GFR; Future  Prediabetes Assessment & Plan: A1c 6.0%. discussed dietary recommendations, low carb diet, and increase in exercise. Recheck in 3 months   Orders: -     Hemoglobin A1c; Future  Mixed hyperlipidemia Assessment & Plan: Your labs showed elevated cholesterol. I recommend consuming a heart healthy diet such as Mediterranean diet or DASH diet with whole grains, fruits, vegetable, fish, lean meats, nuts, and olive oil. Limit sweets and processed foods. I also encourage moderate intensity exercise 150 minutes weekly. This is 3-5 times weekly for 30-50 minutes each session. Goal should be pace of 3 miles/hours, or walking 1.5 miles in 30 minutes. The ASCVD Risk score (Arnett DK, et al., 2019) failed to calculate for the following reasons:   The 2019 ASCVD risk score is only valid for ages 53 to 74   Orders: -     Lipid panel; Future  Other orders -     Lisinopril-hydroCHLOROthiazide; Take 1 tablet by mouth daily.  Dispense: 90 tablet; Refill: 1     Follow up plan: Return in about 3 months (around 01/11/2025) for chronic follow-up with labs 1 week prior.  Jeoffrey GORMAN Barrio, FNP

## 2024-10-11 NOTE — Assessment & Plan Note (Signed)
 A1c 6.0%. discussed dietary recommendations, low carb diet, and increase in exercise. Recheck in 3 months

## 2024-10-18 ENCOUNTER — Ambulatory Visit (INDEPENDENT_AMBULATORY_CARE_PROVIDER_SITE_OTHER): Payer: Self-pay | Admitting: Psychiatry

## 2024-10-18 ENCOUNTER — Encounter (HOSPITAL_COMMUNITY): Payer: Self-pay | Admitting: Psychiatry

## 2024-10-18 DIAGNOSIS — F411 Generalized anxiety disorder: Secondary | ICD-10-CM

## 2024-10-18 MED ORDER — FLUOXETINE HCL 10 MG PO CAPS: 10.0000 mg | ORAL_CAPSULE | Freq: Every day | ORAL | Status: AC

## 2024-10-18 NOTE — Progress Notes (Signed)
 Psychiatric Initial Adult Assessment  Virtual Visit via Video Note  I connected with Lisa Zimmerman on 10/18/24 at 12:30 PM EDT by a video enabled telemedicine application and verified that I am speaking with the correct person using two identifiers.  Location: Patient: Home Provider: Clinic   I discussed the limitations of evaluation and management by telemedicine and the availability of in person appointments. The patient expressed understanding and agreed to proceed.  I provided 45 minutes of non-face-to-face time during this encounter.    Patient Identification: Lisa Zimmerman MRN:  [id number] Date of Evaluation:  10/18/2024 Referral Source: Jeoffrey Barrio, FNP Chief Complaint:  I think that I have anxiety and depression Visit Diagnosis:    ICD-10-CM   1. Generalized anxiety disorder  F41.1       History of Present Illness: 33 year old female seen today for initial psychiatric evaluation.  She was referred to outpatient psychiatry by her PCP for medication management.  She has a psychiatric history of anxiety.  Currently she is managed on Prozac  10 mg.  She informed Clinical research associate that Prozac  has been effective in managing her psychiatric conditions.  Today patient's camera was turned off.  She informed Clinical research associate that she would be more comfortable speaking without it on and reports that she would attempt it on her next visit.  During exam she was pleasant, cooperative, and engaged in conversation.  She informed Clinical research associate that she has been suffering from anxiety and depression.  She notes that she worries about her health, her relationship, her child, and finances.  Patient notes that prior to starting Prozac  she had negative thinking but reports that it has gotten better.  She informed Clinical research associate that she fears increasing it because she does not wish to feel numb.  Provider endorsed understanding.  Patient asked writer why her negative thinking occurs at times.  Provider informed patient that negative  self-talk is common with anxiety, past trauma, medical issues, self-esteem issues and a lot of other things can contribute to negative self-talk.  Provider informed patient that counseling can be effective.    Today provider conducted a GAD-7 and patient scored an 9.  Provider also conducted PHQ-9 and patient scored 8.  She endorses adequate sleep and appetite.  Today she denies SI/HI/AVH, mania, paranoia.  Patient reports that she did experience a trauma but at this time does not wish to discuss it.  She does disclose that her parents are separated when she was younger which she notes was hard.  Patient is currently unemployed and she reports that her unemployment may have something to do with her increased anxiety and depression.  She notes that most days she sleeps at home and isolates.  Patient informed Clinical research associate in the past she used marijuana.  She now notes that she uses THC-A vapes.  She finds it effective in managing her anxiety.  She also reports that within the last 2 months her alcohol intake has increased.  Provider conducted an audit assessment patient scored a 10.  She denies tobacco or illegal drug use.  Provider asked patient if she would be interested in increasing her Prozac  however she was not.  Provided off so offered patient hydroxyzine however at this time she is not interested.  She notes that she will research it and consider in the future.  Patient will follow-up with outpatient counseling for therapy.  No other concerns at this time.  Associated Signs/Symptoms: Depression Symptoms:  depressed mood, hypersomnia, fatigue, difficulty concentrating, anxiety, (Hypo) Manic Symptoms:  Distractibility, Elevated Mood, Irritable Mood, Anxiety Symptoms:  Mild anxiety Psychotic Symptoms:  NA PTSD Symptoms: NA  Past Psychiatric History: Anxiety  Previous Psychotropic Medications: Yes  Prozac    Substance Abuse History in the last 12 months:  Yes.   THC-A  Consequences of  Substance Abuse: NA  Past Medical History:  Past Medical History:  Diagnosis Date   Anemia    Eczema of both hands     Past Surgical History:  Procedure Laterality Date   CHOLECYSTECTOMY  02/02/2020   sinus polyp removal     WISDOM TOOTH EXTRACTION  04/2018    Family Psychiatric History: Mother anxiety and depression  Family History:  Family History  Problem Relation Age of Onset   Depression Mother    Anxiety disorder Mother    Hypertension Father    Heart disease Father    Seizures Brother    Brain cancer Brother     Social History:   Social History   Socioeconomic History   Marital status: Media planner    Spouse name: Franky Pepper   Number of children: 1   Years of education: High School   Highest education level: Associate degree: occupational, Scientist, product/process development, or vocational program  Occupational History   Not on file  Tobacco Use   Smoking status: Never    Passive exposure: Yes   Smokeless tobacco: Never  Vaping Use   Vaping status: Some Days   Substances: THC, CBD  Substance and Sexual Activity   Alcohol use: Yes    Alcohol/week: 2.0 - 3.0 standard drinks of alcohol    Types: 2 - 3 Shots of liquor per week    Comment: weekly - weekends   Drug use: Not Currently    Types: Marijuana   Sexual activity: Yes    Birth control/protection: Injection  Other Topics Concern   Not on file  Social History Narrative   ** Merged History Encounter **       Social Drivers of Health   Financial Resource Strain: Not on file  Food Insecurity: No Food Insecurity (10/21/2023)   Hunger Vital Sign    Worried About Running Out of Food in the Last Year: Never true    Ran Out of Food in the Last Year: Never true  Transportation Needs: No Transportation Needs (10/21/2023)   PRAPARE - Administrator, Civil Service (Medical): No    Lack of Transportation (Non-Medical): No  Physical Activity: Inactive (09/06/2019)   Exercise Vital Sign    Days of Exercise per  Week: 0 days    Minutes of Exercise per Session: 0 min  Stress: Not on file  Social Connections: Unknown (09/06/2019)   Social Connection and Isolation Panel    Frequency of Communication with Friends and Family: Not on file    Frequency of Social Gatherings with Friends and Family: Not on file    Attends Religious Services: Not on file    Active Member of Clubs or Organizations: Not on file    Attends Banker Meetings: Not on file    Marital Status: Never married    Additional Social History: Patient resides in [place] with her son and significant other. She is unemployed. She smoke THC-A. She denies tobacco or illegal drug use.  Allergies:  No Known Allergies  Metabolic Disorder Labs: Lab Results  Component Value Date   HGBA1C 6.0 (H) 10/09/2024   MPG 126 10/09/2024   No results found for: PROLACTIN Lab Results  Component Value Date  CHOL 199 10/09/2024   TRIG 76 10/09/2024   HDL 54 10/09/2024   CHOLHDL 3.7 10/09/2024   LDLCALC 128 (H) 10/09/2024   No results found for: TSH  Therapeutic Level Labs: No results found for: LITHIUM No results found for: CBMZ No results found for: VALPROATE  Current Medications: Current Outpatient Medications  Medication Sig Dispense Refill   FLUoxetine  (PROZAC ) 10 MG capsule Take 1 capsule (10 mg total) by mouth daily.     lisinopril-hydrochlorothiazide (ZESTORETIC) 20-25 MG tablet Take 1 tablet by mouth daily. 90 tablet 1   medroxyPROGESTERone  (DEPO-PROVERA ) 150 MG/ML injection Inject 1 mL (150 mg total) into the muscle every 3 (three) months. 1 mL 3   Multiple Vitamin (MULTIVITAMIN) tablet Take 1 tablet by mouth daily.     ofloxacin (OCUFLOX) 0.3 % ophthalmic solution INSTILL 1 DROP INTO RIGHT EYE 4 TIMES DAILY FOR 7 DAYS     triamcinolone cream (KENALOG) 0.1 % Apply 1 Application topically 2 (two) times daily.     No current facility-administered medications for this visit.    Musculoskeletal: Strength &  Muscle Tone: Telehealth visit camera off Gait & Station: Telehealth visit camera off Patient leans: N/A  Psychiatric Specialty Exam: Review of Systems  There were no vitals taken for this visit.There is no height or weight on file to calculate BMI.  General Appearance: Telehealth visit camera off  Eye Contact:  Telehealth visit camera off  Speech:  Clear and Coherent and Normal Rate  Volume:  Normal  Mood:  Anxious, Depressed, and mild  Affect:  Appropriate and Congruent  Thought Process:  Coherent, Goal Directed, and Linear  Orientation:  Full (Time, Place, and Person)  Thought Content:  WDL and Logical  Suicidal Thoughts:  No  Homicidal Thoughts:  No  Memory:  Immediate;   Good Recent;   Good Remote;   Good  Judgement:  Good  Insight:  Good  Psychomotor Activity:  Normal  Concentration:  Concentration: Good and Attention Span: Good  Recall:  Good  Fund of Knowledge:Good  Language: Good  Akathisia:  No  Handed:  Right  AIMS (if indicated):  not done  Assets:  Communication Skills Desire for Improvement Financial Resources/Insurance Housing Intimacy Leisure Time Physical Health Social Support Vocational/Educational  ADL's:  Intact  Cognition: WNL  Sleep:  Good   Screenings: AUDIT    Loss adjuster, chartered Office Visit from 10/18/2024 in [facility]  Alcohol Use Disorder Identification Test Final Score (AUDIT) 10   GAD-7    Flowsheet Row Office Visit from 10/18/2024 in [facility] Office Visit from 10/11/2024 in [facility] Office Visit from 08/30/2024 in [facility] Office Visit from 10/21/2023 in [facility] Office Visit from 10/08/2022 in [facility]  Total GAD-7 Score 9 4 10 6 8    [employer]    Flowsheet Row Office Visit from 10/18/2024 in [facility] Office Visit from 10/11/2024 in [facility] Office Visit from 08/30/2024 in [facility] Office Visit from 10/21/2023 in [facility] Office Visit from 10/08/2022 in [facility]  PHQ-2 Total Score 3 3 4 3 3   PHQ-9 Total Score 8 6 8 5 5     Assessment and Plan:  Patient endorses mild anxiety and depression.  She utilizes [employer] and alcohol.  At this time patient is not agreeable to increasing Prozac .  She is also not agreeable to trialing hydroxyzine.  She notes that she will research it and consider it at her next visit.  She will follow-up with outpatient counseling for therapy,  1. Generalized anxiety disorder (Primary)  Continue- FLUoxetine  (PROZAC ) 10 MG capsule; Take 1 capsule (10 mg total) by mouth daily.    Collaboration of Care: Other provider involved in patient's care AEB counselor and PCP  Patient/Guardian was advised Release of Information must be obtained prior to any record release in order to collaborate their care with an outside provider. Patient/Guardian was advised if they have not already done so to contact the registration department to sign all necessary forms in order for us  to release information regarding their care.   Consent: Patient/Guardian gives verbal consent for treatment and assignment of benefits for services provided during this visit. Patient/Guardian expressed understanding and agreed to proceed.   Follow-up in 3 months Follow-up with therapy Zane FORBES Bach, NP 10/22/20251:39 PM

## 2024-11-02 ENCOUNTER — Ambulatory Visit: Admitting: Obstetrics and Gynecology

## 2024-11-15 ENCOUNTER — Encounter (HOSPITAL_COMMUNITY): Payer: Self-pay

## 2024-11-20 ENCOUNTER — Ambulatory Visit (HOSPITAL_COMMUNITY): Payer: Self-pay | Admitting: Mental Health

## 2024-11-20 ENCOUNTER — Encounter (HOSPITAL_COMMUNITY): Payer: Self-pay

## 2024-11-20 ENCOUNTER — Telehealth (HOSPITAL_COMMUNITY): Payer: Self-pay | Admitting: Mental Health

## 2024-11-20 NOTE — Telephone Encounter (Signed)
 Therapist sent link for tele-assessment x 2. No response. Left HIPAA compliant voicemail to reschedule missed appointment. NS

## 2024-12-04 ENCOUNTER — Telehealth: Payer: Self-pay

## 2024-12-04 NOTE — Telephone Encounter (Signed)
 Patient left voicemail stating she would like to speak to someone about her payment before her appointment.    Per chart review, pt has nurse visit scheduled 12/07/24.  [place], RN

## 2024-12-07 ENCOUNTER — Ambulatory Visit

## 2024-12-07 ENCOUNTER — Other Ambulatory Visit: Payer: Self-pay

## 2024-12-07 VITALS — BP 153/106 | HR 88 | Ht 65.0 in | Wt 192.9 lb

## 2024-12-07 DIAGNOSIS — Z3042 Encounter for surveillance of injectable contraceptive: Secondary | ICD-10-CM

## 2024-12-07 MED ORDER — MEDROXYPROGESTERONE ACETATE 150 MG/ML IM SUSP
150.0000 mg | Freq: Once | INTRAMUSCULAR | Status: AC
  Administered 2024-12-07: 150 mg via INTRAMUSCULAR

## 2024-12-07 NOTE — Progress Notes (Signed)
 Lisa Zimmerman here for Depo-Provera  Injection. Last injection administered 09/21/24. Injection administered without complication. Patient will return in 3 months for next injection between 02/22/25 and 03/08/25. Patient is due for annual visit. Patient voices no questions or concerns.   Devon, RN 12/07/2024

## 2025-01-03 ENCOUNTER — Telehealth (HOSPITAL_COMMUNITY): Payer: Self-pay | Admitting: Psychiatry

## 2025-01-05 ENCOUNTER — Telehealth (HOSPITAL_COMMUNITY): Payer: Self-pay | Admitting: Psychiatry

## 2025-01-05 ENCOUNTER — Encounter (HOSPITAL_COMMUNITY): Payer: Self-pay

## 2025-02-23 ENCOUNTER — Ambulatory Visit: Payer: Self-pay
